# Patient Record
Sex: Male | Born: 1985 | Race: Black or African American | Hispanic: No | Marital: Single | Smoking: Current some day smoker
Health system: Southern US, Community
[De-identification: ages and names within clinical notes are randomized; demographics above are authoritative.]

## PROBLEM LIST (undated history)

## (undated) DIAGNOSIS — E7800 Pure hypercholesterolemia, unspecified: Secondary | ICD-10-CM

## (undated) DIAGNOSIS — I10 Essential (primary) hypertension: Secondary | ICD-10-CM

## (undated) HISTORY — DX: Essential (primary) hypertension: I10

## (undated) HISTORY — DX: Pure hypercholesterolemia, unspecified: E78.00

---
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
## 2006-01-25 ENCOUNTER — Emergency Department (HOSPITAL_COMMUNITY): Admission: EM | Admit: 2006-01-25 | Discharge: 2006-01-25 | Payer: Self-pay | Admitting: Emergency Medicine

## 2006-08-26 ENCOUNTER — Emergency Department (HOSPITAL_COMMUNITY): Admission: EM | Admit: 2006-08-26 | Discharge: 2006-08-26 | Payer: Self-pay | Admitting: Emergency Medicine

## 2007-06-29 ENCOUNTER — Emergency Department (HOSPITAL_COMMUNITY): Admission: EM | Admit: 2007-06-29 | Discharge: 2007-06-29 | Payer: Self-pay | Admitting: Emergency Medicine

## 2008-01-31 ENCOUNTER — Emergency Department (HOSPITAL_COMMUNITY): Admission: EM | Admit: 2008-01-31 | Discharge: 2008-01-31 | Payer: Self-pay | Admitting: Emergency Medicine

## 2008-05-25 ENCOUNTER — Emergency Department (HOSPITAL_COMMUNITY): Admission: EM | Admit: 2008-05-25 | Discharge: 2008-05-25 | Payer: Self-pay | Admitting: Family Medicine

## 2011-01-15 LAB — RAPID STREP SCREEN (MED CTR MEBANE ONLY): Streptococcus, Group A Screen (Direct): NEGATIVE

## 2011-01-22 LAB — GLUCOSE, CAPILLARY: Glucose-Capillary: 113 — ABNORMAL HIGH

## 2012-01-09 ENCOUNTER — Emergency Department (HOSPITAL_COMMUNITY): Payer: Self-pay

## 2012-01-09 ENCOUNTER — Emergency Department (HOSPITAL_COMMUNITY)
Admission: EM | Admit: 2012-01-09 | Discharge: 2012-01-09 | Disposition: A | Discharge status: Home / Self Care | Payer: Self-pay | Attending: Emergency Medicine | Admitting: Emergency Medicine

## 2012-01-09 ENCOUNTER — Encounter (HOSPITAL_COMMUNITY): Payer: Self-pay | Admitting: Emergency Medicine

## 2012-01-09 DIAGNOSIS — J209 Acute bronchitis, unspecified: Secondary | ICD-10-CM | POA: Insufficient documentation

## 2012-01-09 MED ORDER — SULFAMETHOXAZOLE-TRIMETHOPRIM 800-160 MG PO TABS: 1.0000 | ORAL_TABLET | Freq: Two times a day (BID) | ORAL | Status: DC

## 2012-01-09 MED ORDER — GUAIFENESIN 100 MG/5ML PO LIQD: 100.0000 mg | ORAL | Status: DC | PRN

## 2012-01-09 MED ORDER — ALBUTEROL SULFATE HFA 108 (90 BASE) MCG/ACT IN AERS: 1.0000 | INHALATION_SPRAY | Freq: Four times a day (QID) | RESPIRATORY_TRACT | Status: DC | PRN

## 2012-01-09 NOTE — ED Notes (Signed)
Pt presenting to ed with c/o coughing up greenish colored sputum pt states he has been hot but has not taken his temperature. Pt states he feels short of breath. Pt states he woke up this morning with a headache. Pt states chest pain with cough only. Pt states onset since last night with worsening symptoms this morning. Pt with multiple complaints in triage. Pt states "when I woke up this morning I felt like I was about to faint"

## 2012-01-17 NOTE — ED Provider Notes (Signed)
Medical screening examination/treatment/procedure(s) were performed by non-physician practitioner and as supervising physician I was immediately available for consultation/collaboration.  Kimiya Brunelle T Dicy Smigel, MD 01/17/12 0720 

## 2015-04-21 ENCOUNTER — Encounter (HOSPITAL_COMMUNITY): Payer: Self-pay | Admitting: Emergency Medicine

## 2015-04-21 ENCOUNTER — Emergency Department (HOSPITAL_COMMUNITY): Payer: Self-pay

## 2015-04-21 ENCOUNTER — Emergency Department (HOSPITAL_COMMUNITY)
Admission: EM | Admit: 2015-04-21 | Discharge: 2015-04-21 | Disposition: A | Discharge status: Home / Self Care | Payer: Self-pay | Attending: Emergency Medicine | Admitting: Emergency Medicine

## 2015-04-21 DIAGNOSIS — Y9289 Other specified places as the place of occurrence of the external cause: Secondary | ICD-10-CM | POA: Insufficient documentation

## 2015-04-21 DIAGNOSIS — W450XXA Nail entering through skin, initial encounter: Secondary | ICD-10-CM | POA: Insufficient documentation

## 2015-04-21 DIAGNOSIS — Z792 Long term (current) use of antibiotics: Secondary | ICD-10-CM | POA: Insufficient documentation

## 2015-04-21 DIAGNOSIS — S91332A Puncture wound without foreign body, left foot, initial encounter: Secondary | ICD-10-CM | POA: Insufficient documentation

## 2015-04-21 DIAGNOSIS — F17210 Nicotine dependence, cigarettes, uncomplicated: Secondary | ICD-10-CM | POA: Insufficient documentation

## 2015-04-21 DIAGNOSIS — Z23 Encounter for immunization: Secondary | ICD-10-CM | POA: Insufficient documentation

## 2015-04-21 DIAGNOSIS — Y9389 Activity, other specified: Secondary | ICD-10-CM | POA: Insufficient documentation

## 2015-04-21 DIAGNOSIS — Z79899 Other long term (current) drug therapy: Secondary | ICD-10-CM | POA: Insufficient documentation

## 2015-04-21 DIAGNOSIS — Y998 Other external cause status: Secondary | ICD-10-CM | POA: Insufficient documentation

## 2015-04-21 DIAGNOSIS — Z880 Allergy status to penicillin: Secondary | ICD-10-CM | POA: Insufficient documentation

## 2015-04-21 MED ORDER — IBUPROFEN 200 MG PO TABS
600.0000 mg | ORAL_TABLET | Freq: Once | ORAL | Status: AC
  Administered 2015-04-21: 600 mg via ORAL
  Filled 2015-04-21: qty 3

## 2015-04-21 MED ORDER — CIPROFLOXACIN HCL 500 MG PO TABS: 500.0000 mg | ORAL_TABLET | Freq: Two times a day (BID) | ORAL | Status: DC

## 2015-04-21 MED ORDER — TETANUS-DIPHTH-ACELL PERTUSSIS 5-2.5-18.5 LF-MCG/0.5 IM SUSP
0.5000 mL | Freq: Once | INTRAMUSCULAR | Status: AC
  Administered 2015-04-21: 0.5 mL via INTRAMUSCULAR
  Filled 2015-04-21 (×2): qty 0.5

## 2015-04-21 NOTE — Progress Notes (Addendum)
Pt stepped on a rusty nail. He is unsure when his last tetanus shot was. Left foot soaked in sterile water and betadine.Pt is able to stand on his foot. Tolerated his tetanus shot in the rt deltoid. Pt c/o right groin pain and asked if he could take some heat packs home . He also requested a work note. Pt requested crutches . Ortho tech was called. (6:30pm)

## 2015-04-21 NOTE — ED Notes (Signed)
Pt c/o left foot pain after stepping on nail 3 hours ago. Unsure of last tetanus. Pt unsure if fragment of nail is under skin. Puncture mark present, not currently bleeding.

## 2015-04-21 NOTE — Discharge Instructions (Signed)
Please read and follow all provided instructions.  Your diagnoses today include:  1. Puncture wound of foot, left, initial encounter     Tests performed today include:  Vital signs. See below for your results today.   Medications prescribed:   Ciprofloxacin 500mg  two times a day for 5 days  Home care instructions:  Follow any educational materials contained in this packet. Be sure to wash the wound with soap and water and cover with a bandage.   Follow-up instructions: Please follow-up with your primary care provider in the next 48 hours for further evaluation of symptoms and treatment   Return instructions:   Please return to the Emergency Department if you do not get better, if you get worse, or new symptoms OR  - Fever (temperature greater than 101.40F)  - Bleeding that does not stop with holding pressure to the area    -Severe pain (please note that you may be more sore the day after your accident)  - Chest Pain  - Difficulty breathing  - Severe nausea or vomiting  - Inability to tolerate food and liquids  - Passing out  - Skin becoming red around your wounds  - Change in mental status (confusion or lethargy)  - New numbness or weakness     Please return if you have any other emergent concerns.  Additional Information:  Your vital signs today were: BP 137/92 mmHg   Pulse 116   Temp(Src) 98 F (36.7 C) (Oral)   Resp 18   SpO2 96% If your blood pressure (BP) was elevated above 135/85 this visit, please have this repeated by your doctor within one month. ---------------

## 2015-04-21 NOTE — ED Provider Notes (Signed)
CSN: [id number]     Arrival date & time 04/21/15  1702 History  By signing my name below, I, Evon Slackerrance Branch, attest that this documentation has been prepared under the direction and in the presence of Audry Piliyler Alfio Loescher, PA-C. Electronically Signed: Evon Slackerrance Branch, ED Scribe. 04/21/2015. 5:30 PM.    Chief Complaint  Patient presents with  . Foot Injury   Patient is a 29 y.o. male presenting with foot injury. The history is provided by the patient. No language interpreter was used.  Foot Injury Associated symptoms: no back pain, no fatigue and no fever    HPI Comments: Brian Mclean is a 29 y.o. male who presents to the Emergency Department complaining of left foot injury onset 3 hours PTA. Pt rates the severity of pain 10/10 and throbbing. Pt states that he stepped on a nail that went through his shoe. The nail was located on a piece of wood and appeared rusted. Pt states that he immediately removed the nail due to it still being in the shoe. Pt states that bleeding is controlled after applying peroxide and alcohol. Pt denies any medications PTA. Pt states that that pain is worse when ambulating or bearing weight.  Pt denies numbness or tingling.   Pt is also complaining of a chronic umbilical hernia that worse when coughing. Denies nausea, vomiting Sob or CP. Pt reports smoking 4-5 cigarettes per day for the last 16 years.     History reviewed. No pertinent past medical history. History reviewed. No pertinent past surgical history. No family history on file. Social History  Substance Use Topics  . Smoking status: Current Every Day Smoker    Types: Cigarettes  . Smokeless tobacco: None  . Alcohol Use: No    Review of Systems  Constitutional: Negative for fever, chills, diaphoresis and fatigue.  HENT: Negative for congestion, sinus pressure, sore throat and tinnitus.   Eyes: Negative for visual disturbance.  Respiratory: Negative for cough and shortness of breath.   Cardiovascular:  Negative for chest pain.  Gastrointestinal: Negative for nausea, vomiting, abdominal pain, diarrhea and constipation.  Endocrine: Negative for cold intolerance and heat intolerance.  Genitourinary: Negative for dysuria and hematuria.  Musculoskeletal: Negative for back pain.  Skin: Positive for wound. Negative for color change.  Neurological: Negative for dizziness, syncope, weakness, numbness and headaches.   Allergies  Penicillins  Home Medications   Prior to Admission medications   Medication Sig Start Date End Date Taking? Authorizing Provider  albuterol (PROVENTIL HFA;VENTOLIN HFA) 108 (90 BASE) MCG/ACT inhaler Inhale 1-2 puffs into the lungs every 6 (six) hours as needed for wheezing. 01/09/12   Kaitlyn Szekalski, PA-C  guaiFENesin (ROBITUSSIN) 100 MG/5ML liquid Take 5-10 mLs (100-200 mg total) by mouth every 4 (four) hours as needed for cough. 01/09/12   Kaitlyn Szekalski, PA-C  Pseudoeph-Doxylamine-DM-APAP (NYQUIL MULTI-SYMPTOM PO) Take by mouth once.    Historical Provider, MD  sulfamethoxazole-trimethoprim (SEPTRA DS) 800-160 MG per tablet Take 1 tablet by mouth 2 (two) times daily. 01/09/12   Kaitlyn Szekalski, PA-C   BP 137/92 mmHg  Pulse 116  Temp(Src) 98 F (36.7 C) (Oral)  Resp 18  SpO2 96%   Physical Exam  Constitutional: He is oriented to person, place, and time. He appears well-developed and well-nourished. No distress.  HENT:  Head: Normocephalic and atraumatic.  Eyes: Conjunctivae and EOM are normal.  Neck: Neck supple. No tracheal deviation present.  Cardiovascular: Normal rate and regular rhythm.   Pulmonary/Chest: Effort normal and breath sounds normal.  No respiratory distress.  Abdominal: Normal appearance. There is no tenderness.    Musculoskeletal:       Left ankle: He exhibits decreased range of motion. He exhibits no swelling and no deformity.  Left Foot: Pain with flexion and extension, good cap refill, distal pulses intact  Neurological: He is alert  and oriented to person, place, and time.  Skin: Skin is warm and dry.  Psychiatric: He has a normal mood and affect. His speech is normal and behavior is normal.  Nursing note and vitals reviewed.   ED Course  Procedures (including critical care time) DIAGNOSTIC STUDIES: Oxygen Saturation is 96% on RA, normal by my interpretation.    COORDINATION OF CARE: 5:30 PM-Discussed treatment plan with pt at bedside and pt agreed to plan.   Labs Review Labs Reviewed - No data to display  Imaging Review Dg Foot Complete Left  04/21/2015  CLINICAL DATA:  Plantar left foot pain after being punctured in the plantar aspect of the foot with a nail today. EXAM: LEFT FOOT - COMPLETE 3+ VIEW COMPARISON:  None. FINDINGS: Posterior calcaneal spur formation. Minimal distal anterior tibial spur formation. No fracture, dislocation or radiopaque foreign body seen. No soft tissue air visualized. IMPRESSION: No fracture. Electronically Signed   By: Beckie Salts M.D.   On: 04/21/2015 18:03     EKG Interpretation None      MDM  I have reviewed relevant imaging studies. I obtained HPI from historian.  ED Course: XR L Foot to see if foreign body present- No foreign body. No fracture.   Assessment: 46y M with no sig pmh presents with puncture wound to arch of left foot. Pulses are intact. Good cap refill in all digits. Able to move ankle with minimal discomfort. Sensation intact. VS Stable. Patient in NAD. Will order Xray to see if foreign body still present. Will give tetanus and Rx for Cipro at discharge due to possible Pseudomonas infection.    Disposition/Plan:  DC home with ABX, given crutches for support Additional Verbal discharge instructions given and discussed with patient.  Pt Instructed to f/u with PCP in the next 48 hours for evaluation and treatment of symptoms.  Patient was discussed with Derwood Kaplan, MD   Final diagnoses:  Puncture wound of foot, left, initial encounter     I  personally performed the services described in this documentation, which was scribed in my presence. The recorded information has been reviewed and is accurate.     Audry Pili, PA-C 04/21/15 1610  Derwood Kaplan, MD 04/21/15 [phone]

## 2015-05-04 ENCOUNTER — Encounter (HOSPITAL_COMMUNITY): Payer: Self-pay | Admitting: Emergency Medicine

## 2015-05-04 ENCOUNTER — Emergency Department (HOSPITAL_COMMUNITY)
Admission: EM | Admit: 2015-05-04 | Discharge: 2015-05-05 | Disposition: A | Discharge status: Home / Self Care | Payer: Self-pay | Attending: Emergency Medicine | Admitting: Emergency Medicine

## 2015-05-04 DIAGNOSIS — Z79899 Other long term (current) drug therapy: Secondary | ICD-10-CM | POA: Insufficient documentation

## 2015-05-04 DIAGNOSIS — Z792 Long term (current) use of antibiotics: Secondary | ICD-10-CM | POA: Insufficient documentation

## 2015-05-04 DIAGNOSIS — M79672 Pain in left foot: Secondary | ICD-10-CM | POA: Insufficient documentation

## 2015-05-04 DIAGNOSIS — F17210 Nicotine dependence, cigarettes, uncomplicated: Secondary | ICD-10-CM | POA: Insufficient documentation

## 2015-05-04 DIAGNOSIS — Z880 Allergy status to penicillin: Secondary | ICD-10-CM | POA: Insufficient documentation

## 2015-05-04 NOTE — ED Notes (Signed)
Pt states he stepped on a nail about 3 weeks ago and it went through his shoe and punctured the arch of his foot   Pt states he was seen and given some ibuprofen  Pt states his foot continues to hurt and he thinks he may have some bruising on the top of his foot  Pt states his job requires him to be on his feet a lot and he is unable to do that due to the pain

## 2015-05-04 NOTE — ED Provider Notes (Signed)
CSN: [id number]     Arrival date & time 05/04/15  2010 History  By signing my name below, I, Gonzella LexKimberly Bianca Gray, attest that this documentation has been prepared under the direction and in the presence of Sharilyn SitesLisa Jalia Zuniga, PA-C. Electronically Signed: Gonzella LexKimberly Bianca Gray, Scribe. 05/04/2015. 10:01 PM.   Chief Complaint  Patient presents with  . Foot Pain    The history is provided by the patient. No language interpreter was used.    HPI Comments: Brian Mclean is a 30 y.o. male who presents to the Emergency Department complaining of left foot pain. Patient states he stepped on a nail 3 weeks ago. He is wearing a boot at the time, however nail went through his shoe and into his foot. He was seen in the ED afterwards. His x-ray was negative.  Patient states he has had persistent pain in his left foot since this time. Pain is worse with ambulation. Patient states his job requires him to stand for long periods of time. He has not followed up with anyone regarding this since his initial ED visit.  Denies numbness/weakness of left foot.  No fever, chills, drainage.  No hx of DM.  Tetanus was updated at prior visit.   History reviewed. No pertinent past medical history. History reviewed. No pertinent past surgical history. Family History  Problem Relation Age of Onset  . Hypertension Other   . Stroke Other   . CAD Other   . Diabetes Other   . Asthma Other    Social History  Substance Use Topics  . Smoking status: Current Every Day Smoker    Types: Cigarettes  . Smokeless tobacco: None  . Alcohol Use: No    Review of Systems  Musculoskeletal: Positive for arthralgias.  All other systems reviewed and are negative.  Allergies  Penicillins  Home Medications   Prior to Admission medications   Medication Sig Start Date End Date Taking? Authorizing Provider  albuterol (PROVENTIL HFA;VENTOLIN HFA) 108 (90 BASE) MCG/ACT inhaler Inhale 1-2 puffs into the lungs every 6 (six) hours as needed  for wheezing. 01/09/12   Kaitlyn Szekalski, PA-C  ciprofloxacin (CIPRO) 500 MG tablet Take 1 tablet (500 mg total) by mouth 2 (two) times daily. 04/21/15   Audry Piliyler Mohr, PA-C  guaiFENesin (ROBITUSSIN) 100 MG/5ML liquid Take 5-10 mLs (100-200 mg total) by mouth every 4 (four) hours as needed for cough. 01/09/12   Kaitlyn Szekalski, PA-C  Pseudoeph-Doxylamine-DM-APAP (NYQUIL MULTI-SYMPTOM PO) Take by mouth once.    Historical Provider, MD  sulfamethoxazole-trimethoprim (SEPTRA DS) 800-160 MG per tablet Take 1 tablet by mouth 2 (two) times daily. 01/09/12   Kaitlyn Szekalski, PA-C   BP 134/91 mmHg  Pulse 86  Temp(Src) 98 F (36.7 C) (Oral)  Resp 18  Wt 288 lb (130.636 kg)  SpO2 100%   Physical Exam  Constitutional: He is oriented to person, place, and time. He appears well-developed and well-nourished. No distress.  HENT:  Head: Normocephalic and atraumatic.  Mouth/Throat: Oropharynx is clear and moist.  Eyes: Conjunctivae and EOM are normal. Pupils are equal, round, and reactive to light.  Neck: Normal range of motion. Neck supple.  Cardiovascular: Normal rate, regular rhythm and normal heart sounds.   Pulmonary/Chest: Effort normal and breath sounds normal. No respiratory distress. He has no wheezes.  Musculoskeletal: Normal range of motion. He exhibits no edema.  Left foot grossly normal in appearance aside from evidence of well healed puncture wound to sole of foot; no surrounding redness, swelling, or  signs of infection; ambulatory  Neurological: He is alert and oriented to person, place, and time.  Skin: Skin is warm and dry. He is not diaphoretic.  Psychiatric: He has a normal mood and affect.  Nursing note and vitals reviewed.   ED Course  Procedures (including critical care time) DIAGNOSTIC STUDIES:    Oxygen Saturation is 100% on RA, normal by my interpretation.   COORDINATION OF CARE:  10:01 PM Discussed treatment plan with pt at bedside and pt agreed to plan.   Labs  Review Labs Reviewed - No data to display  Imaging Review No results found. I have personally reviewed and evaluated these images and lab results as part of my medical decision-making.   EKG Interpretation None      MDM   Final diagnoses:  Left foot pain   30 y.o. M here with continued left foot pain after stepping on a nail 3 weeks ago.  Patient has no signs of infection on exam.  He has a well healed puncture wound to sole of left foot.  He is ambulatory without difficulty.  Initial x-ray done at time of injury was negative for any acute findings.  At this time, do not feel further intervention warranted at this time.  Patient will be d/c home with supportive care. He was given referral to orthopedics if needed.  Discussed plan with patient, he/she acknowledged understanding and agreed with plan of care.  Rx naprosyn.  Return precautions given for new or worsening symptoms.  I personally performed the services described in this documentation, which was scribed in my presence. The recorded information has been reviewed and is accurate.   Garlon Hatchet, PA-C 05/05/15 1610  April Palumbo, MD 05/05/15 0100

## 2015-05-05 MED ORDER — NAPROXEN 500 MG PO TABS: 500.0000 mg | ORAL_TABLET | Freq: Two times a day (BID) | ORAL | Status: DC

## 2015-05-05 NOTE — Discharge Instructions (Signed)
Wear ace wrap to help with pain. You may follow-up with orthopedics if needed. Take naprosyn as directed for pain control. Return here for new concerns.

## 2015-10-31 ENCOUNTER — Encounter (HOSPITAL_COMMUNITY): Payer: Self-pay | Admitting: Emergency Medicine

## 2015-10-31 ENCOUNTER — Emergency Department (HOSPITAL_COMMUNITY)
Admission: EM | Admit: 2015-10-31 | Discharge: 2015-10-31 | Disposition: A | Discharge status: Home / Self Care | Payer: Self-pay | Attending: Emergency Medicine | Admitting: Emergency Medicine

## 2015-10-31 DIAGNOSIS — M545 Low back pain, unspecified: Secondary | ICD-10-CM

## 2015-10-31 DIAGNOSIS — F17210 Nicotine dependence, cigarettes, uncomplicated: Secondary | ICD-10-CM | POA: Insufficient documentation

## 2015-10-31 MED ORDER — METHOCARBAMOL 500 MG PO TABS: 500.0000 mg | ORAL_TABLET | Freq: Two times a day (BID) | ORAL | Status: DC

## 2015-10-31 MED ORDER — NAPROXEN 500 MG PO TABS: 500.0000 mg | ORAL_TABLET | Freq: Two times a day (BID) | ORAL | Status: DC

## 2015-10-31 MED ORDER — METHOCARBAMOL 500 MG PO TABS
500.0000 mg | ORAL_TABLET | Freq: Once | ORAL | Status: AC
  Administered 2015-10-31: 500 mg via ORAL
  Filled 2015-10-31: qty 1

## 2015-10-31 NOTE — ED Notes (Signed)
Pt c/o lower back pain x1 week. Denies injury and urinary symptoms.

## 2015-10-31 NOTE — ED Provider Notes (Signed)
CSN: [id number]     Arrival date & time 10/31/15  1201 History  By signing my name below, I, Tanda Rockers, attest that this documentation has been prepared under the direction and in the presence of Tiahna Cure, PA-C. Electronically Signed: Tanda Rockers, ED Scribe. 10/31/2015. 2:09 PM.   Chief Complaint  Patient presents with  . Back Pain   The history is provided by the patient. No language interpreter was used.    HPI Comments: Brian Mclean is a 30 y.o. male who presents to the Emergency Department complaining of gradual onset, constant, diffuse lower back pain x 1 week. Pt states the back pain has been mild and aching over the past week but worsened today. Pt reports that he began having intermittent spasms to the area today, prompting him to come to the ED. He states that the pain was so severe that he had to leave work. The pain is exacerbated by bending and lifting. The pain intermittently radiates to right lateral hip when the spasms are present. No recent trauma or injury to the back. Pt does do repetitive bending and heavy lifting at work. He has been taking Aleve without relief. No hx of similar back pain. He is able to ambulate. Denies fever, abdominal pain, nausea, vomiting, dysuria, weakness, numbness, urinary or bowel incontinence, saddle anesthesia, or any other associated symptoms. No hx of CA or IVDU. Pt does not have a PCP.   History reviewed. No pertinent past medical history. History reviewed. No pertinent past surgical history. Family History  Problem Relation Age of Onset  . Hypertension Other   . Stroke Other   . CAD Other   . Diabetes Other   . Asthma Other    Social History  Substance Use Topics  . Smoking status: Current Every Day Smoker    Types: Cigarettes  . Smokeless tobacco: None  . Alcohol Use: No    Review of Systems  Constitutional: Negative for fever.  Gastrointestinal: Negative for nausea and vomiting.  Musculoskeletal: Positive for back  pain. Negative for gait problem and neck pain.  Neurological: Negative for weakness and numbness.  All other systems reviewed and are negative.  Allergies  Penicillins  Home Medications   Prior to Admission medications   Medication Sig Start Date End Date Taking? Authorizing Provider  albuterol (PROVENTIL HFA;VENTOLIN HFA) 108 (90 BASE) MCG/ACT inhaler Inhale 1-2 puffs into the lungs every 6 (six) hours as needed for wheezing. 01/09/12   Kaitlyn Szekalski, PA-C  ciprofloxacin (CIPRO) 500 MG tablet Take 1 tablet (500 mg total) by mouth 2 (two) times daily. 04/21/15   Audry Pili, PA-C  guaiFENesin (ROBITUSSIN) 100 MG/5ML liquid Take 5-10 mLs (100-200 mg total) by mouth every 4 (four) hours as needed for cough. 01/09/12   Kaitlyn Szekalski, PA-C  methocarbamol (ROBAXIN) 500 MG tablet Take 1 tablet (500 mg total) by mouth 2 (two) times daily. 10/31/15   Jarielys Girardot, PA-C  naproxen (NAPROSYN) 500 MG tablet Take 1 tablet (500 mg total) by mouth 2 (two) times daily with a meal. 05/05/15   Garlon Hatchet, PA-C  naproxen (NAPROSYN) 500 MG tablet Take 1 tablet (500 mg total) by mouth 2 (two) times daily. 10/31/15   Tiffiany Beadles, PA-C  Pseudoeph-Doxylamine-DM-APAP (NYQUIL MULTI-SYMPTOM PO) Take by mouth once.    Historical Provider, MD  sulfamethoxazole-trimethoprim (SEPTRA DS) 800-160 MG per tablet Take 1 tablet by mouth 2 (two) times daily. 01/09/12   Kaitlyn Szekalski, PA-C   BP 137/90 mmHg  Pulse  82  Temp(Src) 98 F (36.7 C) (Oral)  Resp 20  Ht 5\' 11"  (1.803 m)  Wt 130.182 kg  BMI 40.05 kg/m2  SpO2 99%   Physical Exam  Constitutional: He appears well-developed and well-nourished. No distress.  Nontoxic appearing  HENT:  Head: Normocephalic and atraumatic.  Right Ear: External ear normal.  Left Ear: External ear normal.  Eyes: Conjunctivae are normal. Right eye exhibits no discharge. Left eye exhibits no discharge. No scleral icterus.  Neck: Normal range of motion.  Cardiovascular: Normal  rate and intact distal pulses.   Pulmonary/Chest: Effort normal.  Abdominal: Soft. He exhibits no distension. There is no tenderness.  Musculoskeletal: Normal range of motion.       Lumbar back: He exhibits tenderness. He exhibits normal range of motion, no bony tenderness and no deformity.  Mild, generalized TTP of the lumbar region. No focal TTP of the lumbar spine. No bony deformities or step offs. FROM of the spine intact. Negative SLR. FROM of BLE intact. Pt ambulates with a steady gait unassisted.   Neurological: He is alert. Coordination normal.  5/5 strength of the BLE. Sensation to light touch intact throughout.   Skin: Skin is warm and dry.  Psychiatric: He has a normal mood and affect. His behavior is normal.  Nursing note and vitals reviewed.   ED Course  Procedures (including critical care time)  DIAGNOSTIC STUDIES: Oxygen Saturation is 98% on RA, normal by my interpretation.    COORDINATION OF CARE: 2:07 PM-Discussed treatment plan which includes Rx muscle relaxant and referral to PCP/orthopedist with pt at bedside and pt agreed to plan.   Labs Review Labs Reviewed - No data to display  Imaging Review No results found. I have personally reviewed and evaluated these images and lab results as part of my medical decision-making.   EKG Interpretation None      MDM   Final diagnoses:  Bilateral low back pain without sciatica   Patient presenting with back pain x 1 week with worsening today. Hx of physically demanding job with heavy lifting. Afebrile. Non-focal neuro exam. Mild generalized lumbar TTP without focal midline TTP. Negative SLR. Patient is able to ambulate though with some discomfort. No loss of bowel or bladder control. No numbness or weakness in the lower extremities. No concern for cauda equina. No history of IVDU or cancer. No back pain red flags. Conservative therapy including back exercises, heat, ice, tylenol or ibuprofen discussed. Will give muscle  relaxer for back spasms. Discussed side effects of muscle relaxer and avoiding driving. Pt is to follow up with PCP as needed. Given referral infromatino for [facility]. Return precautions discussed and given in discharge paperwork. Pt is stable for discharge.   I personally performed the services described in this documentation, which was scribed in my presence. The recorded information has been reviewed and is accurate.      Alveta HeimlichStevi Shakita Keir, PA-C 10/31/15 1421  Linwood DibblesJon Knapp, MD 11/01/15 1029

## 2015-10-31 NOTE — Discharge Instructions (Signed)
Back Pain, Adult Back pain is very common in adults.The cause of back pain is rarely dangerous and the pain often gets better over time.The cause of your back pain may not be known. Some common causes of back pain include:  Strain of the muscles or ligaments supporting the spine.  Wear and tear (degeneration) of the spinal disks.  Arthritis.  Direct injury to the back. For many people, back pain may return. Since back pain is rarely dangerous, most people can learn to manage this condition on their own. HOME CARE INSTRUCTIONS Watch your back pain for any changes. The following actions may help to lessen any discomfort you are feeling:  Remain active. It is stressful on your back to sit or stand in one place for long periods of time. Do not sit, drive, or stand in one place for more than 30 minutes at a time. Take short walks on even surfaces as soon as you are able.Try to increase the length of time you walk each day.  Exercise regularly as directed by your health care provider. Exercise helps your back heal faster. It also helps avoid future injury by keeping your muscles strong and flexible.  Do not stay in bed.Resting more than 1-2 days can delay your recovery.  Pay attention to your body when you bend and lift. The most comfortable positions are those that put less stress on your recovering back. Always use proper lifting techniques, including:  Bending your knees.  Keeping the load close to your body.  Avoiding twisting.  Find a comfortable position to sleep. Use a firm mattress and lie on your side with your knees slightly bent. If you lie on your back, put a pillow under your knees.  Avoid feeling anxious or stressed.Stress increases muscle tension and can worsen back pain.It is important to recognize when you are anxious or stressed and learn ways to manage it, such as with exercise.  Take medicines only as directed by your health care provider. Over-the-counter  medicines to reduce pain and inflammation are often the most helpful.Your health care provider may prescribe muscle relaxant drugs.These medicines help dull your pain so you can more quickly return to your normal activities and healthy exercise.  Apply ice to the injured area:  Put ice in a plastic bag.  Place a towel between your skin and the bag.  Leave the ice on for 20 minutes, 2-3 times a day for the first 2-3 days. After that, ice and heat may be alternated to reduce pain and spasms.  Maintain a healthy weight. Excess weight puts extra stress on your back and makes it difficult to maintain good posture. SEEK MEDICAL CARE IF:  You have pain that is not relieved with rest or medicine.  You have increasing pain going down into the legs or buttocks.  You have pain that does not improve in one week.  You have night pain.  You lose weight.  You have a fever or chills. SEEK IMMEDIATE MEDICAL CARE IF:   You develop new bowel or bladder control problems.  You have unusual weakness or numbness in your arms or legs.  You develop nausea or vomiting.  You develop abdominal pain.  You feel faint.   This information is not intended to replace advice given to you by your health care provider. Make sure you discuss any questions you have with your health care provider.   Document Released: 04/09/2005 Document Revised: 04/30/2014 Document Reviewed: 08/11/2013 Elsevier Interactive Patient Education 2016 [employer]  Back Exercises The following exercises strengthen the muscles that help to support the back. They also help to keep the lower back flexible. Doing these exercises can help to prevent back pain or lessen existing pain. If you have back pain or discomfort, try doing these exercises 2-3 times each day or as told by your health care provider. When the pain goes away, do them once each day, but increase the number of times that you repeat the steps for each exercise (do more  repetitions). If you do not have back pain or discomfort, do these exercises once each day or as told by your health care provider. EXERCISES Single Knee to Chest Repeat these steps 3-5 times for each leg:  Lie on your back on a firm bed or the floor with your legs extended.  Bring one knee to your chest. Your other leg should stay extended and in contact with the floor.  Hold your knee in place by grabbing your knee or thigh.  Pull on your knee until you feel a gentle stretch in your lower back.  Hold the stretch for 10-30 seconds.  Slowly release and straighten your leg. Pelvic Tilt Repeat these steps 5-10 times:  Lie on your back on a firm bed or the floor with your legs extended.  Bend your knees so they are pointing toward the ceiling and your feet are flat on the floor.  Tighten your lower abdominal muscles to press your lower back against the floor. This motion will tilt your pelvis so your tailbone points up toward the ceiling instead of pointing to your feet or the floor.  With gentle tension and even breathing, hold this position for 5-10 seconds. Cat-Cow Repeat these steps until your lower back becomes more flexible:  Get into a hands-and-knees position on a firm surface. Keep your hands under your shoulders, and keep your knees under your hips. You may place padding under your knees for comfort.  Let your head hang down, and point your tailbone toward the floor so your lower back becomes rounded like the back of a cat.  Hold this position for 5 seconds.  Slowly lift your head and point your tailbone up toward the ceiling so your back forms a sagging arch like the back of a cow.  Hold this position for 5 seconds. Press-Ups Repeat these steps 5-10 times:  Lie on your abdomen (face-down) on the floor.  Place your palms near your head, about shoulder-width apart.  While you keep your back as relaxed as possible and keep your hips on the floor, slowly straighten  your arms to raise the top half of your body and lift your shoulders. Do not use your back muscles to raise your upper torso. You may adjust the placement of your hands to make yourself more comfortable.  Hold this position for 5 seconds while you keep your back relaxed.  Slowly return to lying flat on the floor. Bridges Repeat these steps 10 times:  Lie on your back on a firm surface.  Bend your knees so they are pointing toward the ceiling and your feet are flat on the floor.  Tighten your buttocks muscles and lift your buttocks off of the floor until your waist is at almost the same height as your knees. You should feel the muscles working in your buttocks and the back of your thighs. If you do not feel these muscles, slide your feet 1-2 inches farther away from your buttocks.  Hold this  position for 3-5 seconds.  Slowly lower your hips to the starting position, and allow your buttocks muscles to relax completely. If this exercise is too easy, try doing it with your arms crossed over your chest. Abdominal Crunches Repeat these steps 5-10 times:  Lie on your back on a firm bed or the floor with your legs extended.  Bend your knees so they are pointing toward the ceiling and your feet are flat on the floor.  Cross your arms over your chest.  Tip your chin slightly toward your chest without bending your neck.  Tighten your abdominal muscles and slowly raise your trunk (torso) high enough to lift your shoulder blades a tiny bit off of the floor. Avoid raising your torso higher than that, because it can put too much stress on your low back and it does not help to strengthen your abdominal muscles.  Slowly return to your starting position. Back Lifts Repeat these steps 5-10 times: 1. Lie on your abdomen (face-down) with your arms at your sides, and rest your forehead on the floor. 2. Tighten the muscles in your legs and your buttocks. 3. Slowly lift your chest off of the floor while  you keep your hips pressed to the floor. Keep the back of your head in line with the curve in your back. Your eyes should be looking at the floor. 4. Hold this position for 3-5 seconds. 5. Slowly return to your starting position. SEEK MEDICAL CARE IF:  Your back pain or discomfort gets much worse when you do an exercise.  Your back pain or discomfort does not lessen within 2 hours after you exercise. If you have any of these problems, stop doing these exercises right away. Do not do them again unless your health care provider says that you can. SEEK IMMEDIATE MEDICAL CARE IF:  You develop sudden, severe back pain. If this happens, stop doing the exercises right away. Do not do them again unless your health care provider says that you can.   This information is not intended to replace advice given to you by your health care provider. Make sure you discuss any questions you have with your health care provider.   Document Released: 05/17/2004 Document Revised: 12/29/2014 Document Reviewed: 06/03/2014 Elsevier Interactive Patient Education 2016 [employer] Injury Prevention Back injuries can be very painful. They can also be difficult to heal. After having one back injury, you are more likely to injure your back again. It is important to learn how to avoid injuring or re-injuring your back. The following tips can help you to prevent a back injury. WHAT SHOULD I KNOW ABOUT PHYSICAL FITNESS?  Exercise for 30 minutes per day on most days of the week or as directed by your health care provider. Make sure to:  Do aerobic exercises, such as walking, jogging, biking, or swimming.  Do exercises that increase balance and strength, such as tai chi and yoga. These can decrease your risk of falling and injuring your back.  Do stretching exercises to help with flexibility.  Try to develop strong abdominal muscles. Your abdominal muscles provide a lot of the support that is needed by your  back.  Maintain a healthy weight. This helps to decrease your risk of a back injury. WHAT SHOULD I KNOW ABOUT MY DIET?  Talk with your health care provider about your overall diet. Take supplements and vitamins only as directed by your health care provider.  Talk with your health care provider about how  much calcium and vitamin D you need each day. These nutrients help to prevent weakening of the bones (osteoporosis). Osteoporosis can cause broken (fractured) bones, which lead to back pain.  Include good sources of calcium in your diet, such as dairy products, green leafy vegetables, and products that have had calcium added to them (fortified).  Include good sources of vitamin D in your diet, such as milk and foods that are fortified with vitamin D. WHAT SHOULD I KNOW ABOUT MY POSTURE?  Sit up straight and stand up straight. Avoid leaning forward when you sit or hunching over when you stand.  Choose chairs that have good low-back (lumbar) support.  If you work at a desk, sit close to it so you do not need to lean over. Keep your chin tucked in. Keep your neck drawn back, and keep your elbows bent at a right angle. Your arms should look like the letter "L."  Sit high and close to the steering wheel when you drive. Add a lumbar support to your car seat, if needed.  Avoid sitting or standing in one position for very long. Take breaks to get up, stretch, and walk around at least one time every hour. Take breaks every hour if you are driving for long periods of time.  Sleep on your side with your knees slightly bent, or sleep on your back with a pillow under your knees. Do not lie on the front of your body to sleep. WHAT SHOULD I KNOW ABOUT LIFTING, TWISTING, AND REACHING? Lifting and Heavy Lifting  Avoid heavy lifting, especially repetitive heavy lifting. If you must do heavy lifting:  Stretch before lifting.  Work slowly.  Rest between lifts.  Use a tool such as a cart or a dolly to  move objects if one is available.  Make several small trips instead of carrying one heavy load.  Ask for help when you need it, especially when moving big objects.  Follow these steps when lifting:  Stand with your feet shoulder-width apart.  Get as close to the object as you can. Do not try to pick up a heavy object that is far from your body.  Use handles or lifting straps if they are available.  Bend at your knees. Squat down, but keep your heels off the floor.  Keep your shoulders pulled back, your chin tucked in, and your back straight.  Lift the object slowly while you tighten the muscles in your legs, abdomen, and buttocks. Keep the object as close to the center of your body as possible.  Follow these steps when putting down a heavy load:  Stand with your feet shoulder-width apart.  Lower the object slowly while you tighten the muscles in your legs, abdomen, and buttocks. Keep the object as close to the center of your body as possible.  Keep your shoulders pulled back, your chin tucked in, and your back straight.  Bend at your knees. Squat down, but keep your heels off the floor.  Use handles or lifting straps if they are available. Twisting and Reaching  Avoid lifting heavy objects above your waist.  Do not twist at your waist while you are lifting or carrying a load. If you need to turn, move your feet.  Do not bend over without bending at your knees.  Avoid reaching over your head, across a table, or for an object on a high surface. WHAT ARE SOME OTHER TIPS?  Avoid wet floors and icy ground. Keep sidewalks clear of ice  to prevent falls.  Do not sleep on a mattress that is too soft or too hard.  Keep items that are used frequently within easy reach.  Put heavier objects on shelves at waist level, and put lighter objects on lower or higher shelves.  Find ways to decrease your stress, such as exercise, massage, or relaxation techniques. Stress can build up in  your muscles. Tense muscles are more vulnerable to injury.  Talk with your health care provider if you feel anxious or depressed. These conditions can make back pain worse.  Wear flat heel shoes with cushioned soles.  Avoid sudden movements.  Use both shoulder straps when carrying a backpack.  Do not use any tobacco products, including cigarettes, chewing tobacco, or electronic cigarettes. If you need help quitting, ask your health care provider.   This information is not intended to replace advice given to you by your health care provider. Make sure you discuss any questions you have with your health care provider.   Document Released: 05/17/2004 Document Revised: 08/24/2014 Document Reviewed: 04/13/2014 Elsevier Interactive Patient Education [employer].

## 2016-07-18 ENCOUNTER — Encounter (HOSPITAL_COMMUNITY): Payer: Self-pay | Admitting: Emergency Medicine

## 2016-07-18 ENCOUNTER — Ambulatory Visit (HOSPITAL_COMMUNITY)
Admission: EM | Admit: 2016-07-18 | Discharge: 2016-07-18 | Disposition: A | Discharge status: Home / Self Care | Payer: BLUE CROSS/BLUE SHIELD | Attending: Internal Medicine | Admitting: Internal Medicine

## 2016-07-18 DIAGNOSIS — H6691 Otitis media, unspecified, right ear: Secondary | ICD-10-CM

## 2016-07-18 DIAGNOSIS — R0981 Nasal congestion: Secondary | ICD-10-CM | POA: Diagnosis not present

## 2016-07-18 DIAGNOSIS — R42 Dizziness and giddiness: Secondary | ICD-10-CM | POA: Diagnosis not present

## 2016-07-18 DIAGNOSIS — H65191 Other acute nonsuppurative otitis media, right ear: Secondary | ICD-10-CM

## 2016-07-18 LAB — GLUCOSE, CAPILLARY: Glucose-Capillary: 101 mg/dL — ABNORMAL HIGH (ref 65–99)

## 2016-07-18 MED ORDER — PREDNISONE 50 MG PO TABS: 50.0000 mg | ORAL_TABLET | Freq: Every day | ORAL | 0 refills | Status: DC

## 2016-07-18 MED ORDER — TRIAMCINOLONE ACETONIDE 55 MCG/ACT NA AERO: 2.0000 | INHALATION_SPRAY | Freq: Every day | NASAL | 0 refills | Status: DC

## 2016-07-18 MED ORDER — AZITHROMYCIN 250 MG PO TABS: 250.0000 mg | ORAL_TABLET | Freq: Every day | ORAL | 0 refills | Status: DC

## 2016-07-18 NOTE — ED Notes (Signed)
Pt given peanut butter and graham crackers  

## 2016-07-18 NOTE — Discharge Instructions (Addendum)
Dizziness today may be related to combination of not eating significantly today and sinus/ear infection.  ECG without acute changes.  Prescriptions for prednisone, nasal steroid spray, and zithromax (antibiotic) were sent to the [facility] on [address].  Note for work.  Push fluids, rest.  Eat something with protein every day when you get up.  Recheck for new fever >100.5, increasing phlegm production/nasal discharge or if not starting to improve in a few days.  Anticipate gradual improvement over the next few days.

## 2016-07-18 NOTE — ED Triage Notes (Signed)
[facility] with a complaint of dizziness, lightheadedness and SOB that started this am.

## 2016-07-18 NOTE — ED Provider Notes (Signed)
[facility]    CSN: [id number] Arrival date & time: 07/18/16  1624     History   Chief Complaint Chief Complaint  Patient presents with  . Dizziness    HPI Brian Mclean is a 31 y.o. male. He presents today with lightheadedness/presyncope that occurred at work today.  Has not had this before.  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  He had runny/congested nose last week, some cough, but starting to feel better.  No sore throat.  Nausea, no vomiting.  Stools a little soft but no diarrhea.  No abd pain, no urinary symptoms.  Tactile temp.  Headaches.  Ate an orange about 9am today, nothing since.   HPI  History reviewed. No pertinent past medical history.   History reviewed. No pertinent surgical history.     Home Medications   Takes no meds regularly   Family History Family History  Problem Relation Age of Onset  . Hypertension Other   . Stroke Other   . CAD Other   . Diabetes Other   . Asthma Other     Social History Social History  Substance Use Topics  . Smoking status: Current Every Day Smoker    Types: Cigarettes  . Smokeless tobacco: Not on file  . Alcohol use No     Allergies   Penicillins   Review of Systems Review of Systems  All other systems reviewed and are negative.    Physical Exam Triage Vital Signs ED Triage Vitals  Enc Vitals Group     BP 07/18/16 1640 (!) 133/93     Pulse Rate 07/18/16 1640 (!) 113     Resp 07/18/16 1640 20     Temp 07/18/16 1640 99.5 F (37.5 C)     Temp Source 07/18/16 1640 Oral     SpO2 07/18/16 1640 97 %     Weight --      Height --      Pain Score 07/18/16 1639 0     Pain Loc --    Updated Vital Signs BP (!) 133/93 (BP Location: Right Arm)   Pulse (!) 113   Temp 99.5 F (37.5 C) (Oral)   Resp 20   SpO2 97%   Physical Exam  Constitutional: He is oriented to person, place, and time. No distress.  Alert, nicely groomed Sitting up on exam table.  Sounds very congested.  HENT:  Head:  Atraumatic.  R TM red/dull; L TM mod dull, no erythema Mod nasal congestion Throat red  Eyes:  Conjugate gaze, no eye redness/drainage  Neck: Neck supple.  Cardiovascular: Regular rhythm.   HR 110s  Pulmonary/Chest: No respiratory distress. He has no wheezes. He has no rales.  Lungs clear, symmetric breath sounds  Abdominal: He exhibits no distension.  Musculoskeletal: Normal range of motion.  Neurological: He is alert and oriented to person, place, and time.  Skin: Skin is warm and dry.  No cyanosis  Nursing note and vitals reviewed.    UC Treatments / Results   Procedures Procedures (including critical care time) ECG:  No acute ST or T wave changes, sinus rhythm without dysrhythmia  Final Clinical Impressions(s) / UC Diagnoses   Final diagnoses:  Dizziness  Sinus congestion  Acute right otitis media   Dizziness today may be related to combination of not eating significantly today and sinus/ear infection.  ECG without acute changes.  Prescriptions for prednisone, nasal steroid spray, and zithromax (antibiotic) [facility] on [address].  Note for work.  Push fluids, rest.  Eat something with protein every day when you get up.  Recheck for new fever >100.5, increasing phlegm production/nasal discharge or if not starting to improve in a few days.  Anticipate gradual improvement over the next few days.    New Prescriptions Discharge Medication List as of 07/18/2016  6:33 PM    START taking these medications   Details  azithromycin (ZITHROMAX) 250 MG tablet Take 1 tablet (250 mg total) by mouth daily. Take first 2 tablets together, then 1 every day until finished., Starting Wed 07/18/2016, Normal    predniSONE (DELTASONE) 50 MG tablet Take 1 tablet (50 mg total) by mouth daily., Starting Wed 07/18/2016, Normal    triamcinolone (NASACORT AQ) 55 MCG/ACT AERO nasal inhaler Place 2 sprays into the nose daily., Starting Wed 07/18/2016, Normal         Eustace Moore, MD 07/22/16 2106

## 2016-10-01 ENCOUNTER — Encounter (HOSPITAL_COMMUNITY): Payer: Self-pay | Admitting: Emergency Medicine

## 2016-10-01 DIAGNOSIS — Z79899 Other long term (current) drug therapy: Secondary | ICD-10-CM | POA: Insufficient documentation

## 2016-10-01 DIAGNOSIS — M5441 Lumbago with sciatica, right side: Secondary | ICD-10-CM | POA: Diagnosis not present

## 2016-10-01 DIAGNOSIS — F17210 Nicotine dependence, cigarettes, uncomplicated: Secondary | ICD-10-CM | POA: Diagnosis not present

## 2016-10-01 DIAGNOSIS — M545 Low back pain: Secondary | ICD-10-CM | POA: Diagnosis present

## 2016-10-01 NOTE — ED Triage Notes (Signed)
Pt reports back pain that has been ongoing for the last 2 weeks with radiation to right leg. Denies any urinary symptoms.

## 2016-10-02 ENCOUNTER — Emergency Department (HOSPITAL_COMMUNITY)
Admission: EM | Admit: 2016-10-02 | Discharge: 2016-10-02 | Disposition: A | Discharge status: Home / Self Care | Payer: BLUE CROSS/BLUE SHIELD | Attending: Emergency Medicine | Admitting: Emergency Medicine

## 2016-10-02 DIAGNOSIS — M5441 Lumbago with sciatica, right side: Secondary | ICD-10-CM

## 2016-10-02 MED ORDER — METHYLPREDNISOLONE 4 MG PO TBPK: ORAL_TABLET | ORAL | 0 refills | Status: DC

## 2016-10-02 MED ORDER — KETOROLAC TROMETHAMINE 30 MG/ML IJ SOLN
30.0000 mg | Freq: Once | INTRAMUSCULAR | Status: AC
  Administered 2016-10-02: 30 mg via INTRAMUSCULAR
  Filled 2016-10-02: qty 1

## 2016-10-02 MED ORDER — CYCLOBENZAPRINE HCL 10 MG PO TABS: 10.0000 mg | ORAL_TABLET | Freq: Two times a day (BID) | ORAL | 0 refills | Status: DC | PRN

## 2016-10-02 NOTE — ED Provider Notes (Signed)
[facility] Provider Note   CSN: [id number] Arrival date & time: 10/01/16  2026     History   Chief Complaint Chief Complaint  Patient presents with  . Back Pain    HPI Brian Mclean is a 31 y.o. male.  HPI 31 year old African-American male with no significant past medical history presents to the emergency Department today with complaints of low back pain that radiates to his right leg. The patient states that he does heavy lifting and repetitive bending over. Pain is been there for 2 weeks and has progressively worsened. Moving makes the pain worse. Lying down makes the pain better. He reports intermittent associated paresthesias to the right leg that self resolved. Patient denies any injury. Denies any loss of bowel or bladder, urinary retention, saddle paresthesias, urinary symptoms, fever, chills, nausea, vomiting. Patient has not tried anything for his symptoms except low back stretches. History reviewed. No pertinent past medical history.  There are no active problems to display for this patient.   History reviewed. No pertinent surgical history.     Home Medications    Prior to Admission medications   Medication Sig Start Date End Date Taking? Authorizing Provider  azithromycin (ZITHROMAX) 250 MG tablet Take 1 tablet (250 mg total) by mouth daily. Take first 2 tablets together, then 1 every day until finished. Patient not taking: Reported on 10/02/2016 07/18/16   Eustace Moore, MD  cyclobenzaprine (FLEXERIL) 10 MG tablet Take 1 tablet (10 mg total) by mouth 2 (two) times daily as needed for muscle spasms. 10/02/16   Rise Mu, PA-C  methylPREDNISolone (MEDROL DOSEPAK) 4 MG TBPK tablet Take as prescribed 10/02/16   Demetrios Loll T, PA-C  predniSONE (DELTASONE) 50 MG tablet Take 1 tablet (50 mg total) by mouth daily. Patient not taking: Reported on 10/02/2016 07/18/16   Eustace Moore, MD  triamcinolone (NASACORT AQ) 55 MCG/ACT AERO nasal inhaler  Place 2 sprays into the nose daily. Patient not taking: Reported on 10/02/2016 07/18/16   Eustace Moore, MD    Family History Family History  Problem Relation Age of Onset  . Hypertension Other   . Stroke Other   . CAD Other   . Diabetes Other   . Asthma Other     Social History Social History  Substance Use Topics  . Smoking status: Current Every Day Smoker    Types: Cigarettes  . Smokeless tobacco: Not on file  . Alcohol use No     Allergies   Penicillins   Review of Systems Review of Systems  Constitutional: Negative for chills and fever.  Gastrointestinal: Negative for diarrhea, nausea and vomiting.  Genitourinary: Negative for frequency, hematuria and urgency.  Musculoskeletal: Positive for back pain. Negative for gait problem, neck pain and neck stiffness.     Physical Exam Updated Vital Signs BP 130/80 (BP Location: Left Arm)   Pulse 76   Temp 98.8 F (37.1 C) (Oral)   Resp 14   Ht 6' (1.829 m)   Wt 129.7 kg (286 lb)   SpO2 99%   BMI 38.79 kg/m   Physical Exam  Constitutional: He is oriented to person, place, and time. He appears well-developed and well-nourished. No distress.  Eyes: Right eye exhibits no discharge. Left eye exhibits no discharge. No scleral icterus.  Cardiovascular: Intact distal pulses.   Pulmonary/Chest: No respiratory distress.  Musculoskeletal: Normal range of motion.  Bilateral paraspinal tenderness. No midline L-spine or T-spine tenderness. No deformities or step-offs noted. Full range  of motion. Positive straight leg raise test, right that reproduces pain and paresthesias. Strength 5 out of 5 in lower extremities. Sensation intact to sharp/dull. DP pulses are 2+ bilaterally. Cap refill normal.  Neurological: He is alert and oriented to person, place, and time.  Skin: Skin is warm and dry. Capillary refill takes less than 2 seconds. No pallor.  Nursing note and vitals reviewed.    ED Treatments / Results  Labs (all labs  ordered are listed, but only abnormal results are displayed) Labs Reviewed - No data to display  EKG  EKG Interpretation None       Radiology No results found.  Procedures Procedures (including critical care time)  Medications Ordered in ED Medications  ketorolac (TORADOL) 30 MG/ML injection 30 mg (30 mg Intramuscular Given 10/02/16 0222)     Initial Impression / Assessment and Plan / ED Course  I have reviewed the triage vital signs and the nursing notes.  Pertinent labs & imaging results that were available during my care of the patient were reviewed by me and considered in my medical decision making (see chart for details).     Patient with back pain.  No neurological deficits and normal neuro exam.  Patient can walk but states is painful.  No loss of bowel or bladder control.  No concern for cauda equina.  No fever, night sweats, weight loss, h/o cancer, IVDU.  RICE protocol and pain medicine indicated and discussed with patient.    Final Clinical Impressions(s) / ED Diagnoses   Final diagnoses:  Acute bilateral low back pain with right-sided sciatica    New Prescriptions Discharge Medication List as of 10/02/2016  2:18 AM    START taking these medications   Details  cyclobenzaprine (FLEXERIL) 10 MG tablet Take 1 tablet (10 mg total) by mouth 2 (two) times daily as needed for muscle spasms., Starting Tue 10/02/2016, Print    methylPREDNISolone (MEDROL DOSEPAK) 4 MG TBPK tablet Take as prescribed, Print         Wallace KellerLeaphart, Kenneth T, PA-C 10/02/16 0635    Paula LibraMolpus, John, MD 10/02/16 [phone]

## 2016-10-02 NOTE — Discharge Instructions (Signed)
Please take the Flexeril at night for muscle relaxation. Take the steroid pack as prescribed. Make sure you're altering Motrin and Tylenol. Warm compresses to the low back. Warm soaks in Epsom salt. Avoid heavy lifting and repetitive bending over. If symptoms are not improving in the next week follow-up with orthopedics. Return to the ED for any worsening symptoms.

## 2016-10-29 ENCOUNTER — Encounter (HOSPITAL_COMMUNITY): Payer: Self-pay | Admitting: Emergency Medicine

## 2016-10-29 ENCOUNTER — Emergency Department (HOSPITAL_COMMUNITY)
Admission: EM | Admit: 2016-10-29 | Discharge: 2016-10-29 | Disposition: A | Discharge status: Home / Self Care | Payer: BLUE CROSS/BLUE SHIELD | Attending: Emergency Medicine | Admitting: Emergency Medicine

## 2016-10-29 ENCOUNTER — Emergency Department (HOSPITAL_COMMUNITY): Payer: BLUE CROSS/BLUE SHIELD

## 2016-10-29 DIAGNOSIS — F17210 Nicotine dependence, cigarettes, uncomplicated: Secondary | ICD-10-CM | POA: Insufficient documentation

## 2016-10-29 DIAGNOSIS — N4889 Other specified disorders of penis: Secondary | ICD-10-CM | POA: Diagnosis not present

## 2016-10-29 DIAGNOSIS — N481 Balanitis: Secondary | ICD-10-CM

## 2016-10-29 LAB — URINALYSIS, ROUTINE W REFLEX MICROSCOPIC
BILIRUBIN URINE: NEGATIVE
GLUCOSE, UA: NEGATIVE mg/dL
Ketones, ur: NEGATIVE mg/dL
Nitrite: NEGATIVE
PROTEIN: NEGATIVE mg/dL
SPECIFIC GRAVITY, URINE: 1.015 (ref 1.005–1.030)
pH: 5 (ref 5.0–8.0)

## 2016-10-29 MED ORDER — SULFAMETHOXAZOLE-TRIMETHOPRIM 800-160 MG PO TABS: 1.0000 | ORAL_TABLET | Freq: Two times a day (BID) | ORAL | 0 refills | Status: AC

## 2016-10-29 MED ORDER — NYSTATIN 100000 UNIT/GM EX CREA: TOPICAL_CREAM | CUTANEOUS | 0 refills | Status: DC

## 2016-10-29 NOTE — ED Notes (Signed)
Chaperoned provider with assess the penis.

## 2016-10-29 NOTE — Discharge Instructions (Signed)
Please schedule an appointment as soon as possible for a follow-up visit with urology.  Bactrim is on the $4 list at [facility].  Please take Ibuprofen (Advil, motrin) and Tylenol (acetaminophen) to relieve your pain.  You may take up to 800 MG (4 pills) of normal strength ibuprofen every 8 hours as needed.  In between doses of ibuprofen you make take tylenol, up to 1,000 mg (two extra strength pills).  Do not take more than 3,000 mg tylenol in a 24 hour period.  Please check all medication labels as many medications such as pain and cold medications may contain tylenol.  Do not drink alcohol while taking these medications.  Do not take other NSAID'S while taking ibuprofen (such as aleve or naproxen).  Please take ibuprofen with food to decrease stomach upset.  Please monitor yourself for the warning signs and symptoms that we discussed. Please return to the emergency room if you worsen or have any concerns.

## 2016-10-29 NOTE — ED Triage Notes (Signed)
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 Patient states has swelling in penis but denies any pain with urination or discharge.

## 2016-10-29 NOTE — ED Provider Notes (Signed)
[facility] Provider Note   CSN: [id number] Arrival date & time: 10/29/16  1435   By signing my name below, I, Clarisse Gouge, attest that this documentation has been prepared under the direction and in the presence of Lyndel Safe, PA-C. Electronically Signed: Clarisse Gouge, Scribe. 10/29/16. 4:14 PM.   History   Chief Complaint Chief Complaint  Patient presents with  . Groin Swelling   The history is provided by the patient and medical records. No language interpreter was used.    Brian Mclean is a 31 y.o. male presenting to the Emergency Department concerning penile swelling the pt noticed this morning after a shower. Penile pain with palpation and penile discharge associated. He described 9/10 constant discomfort that is worse with contact and palpation. Last sexual encounter reportedly 2-3 weeks ago with a woman, barrier allegedly used at this time. Patient states that he is unsure if he is circumcised or not. H/o trich reported 5-6 years ago. No recent trauma/ injury. No testicle pain, dysuria, difficulty urinating, abdominal pain, fever or chills. No other complaints at this time.   History reviewed. No pertinent past medical history.  There are no active problems to display for this patient.   History reviewed. No pertinent surgical history.     Home Medications    Prior to Admission medications   Medication Sig Start Date End Date Taking? Authorizing Provider  azithromycin (ZITHROMAX) 250 MG tablet Take 1 tablet (250 mg total) by mouth daily. Take first 2 tablets together, then 1 every day until finished. Patient not taking: Reported on 10/02/2016 07/18/16   Eustace Moore, MD  cyclobenzaprine (FLEXERIL) 10 MG tablet Take 1 tablet (10 mg total) by mouth 2 (two) times daily as needed for muscle spasms. 10/02/16   Rise Mu, PA-C  methylPREDNISolone (MEDROL DOSEPAK) 4 MG TBPK tablet Take as prescribed 10/02/16   Demetrios Loll T, PA-C  nystatin  cream (MYCOSTATIN) Apply to affected area 2 times daily 10/29/16   Cristina Gong, PA-C  predniSONE (DELTASONE) 50 MG tablet Take 1 tablet (50 mg total) by mouth daily. Patient not taking: Reported on 10/02/2016 07/18/16   Eustace Moore, MD  sulfamethoxazole-trimethoprim (BACTRIM DS,SEPTRA DS) 800-160 MG tablet Take 1 tablet by mouth 2 (two) times daily. 10/29/16 11/05/16  Cristina Gong, PA-C  triamcinolone (NASACORT AQ) 55 MCG/ACT AERO nasal inhaler Place 2 sprays into the nose daily. Patient not taking: Reported on 10/02/2016 07/18/16   Eustace Moore, MD    Family History Family History  Problem Relation Age of Onset  . Hypertension Other   . Stroke Other   . CAD Other   . Diabetes Other   . Asthma Other     Social History Social History  Substance Use Topics  . Smoking status: Current Every Day Smoker    Types: Cigarettes  . Smokeless tobacco: Never Used  . Alcohol use No     Allergies   Penicillins   Review of Systems Review of Systems  Constitutional: Negative for chills and fever.  HENT: Negative for congestion.   Eyes: Negative for photophobia and visual disturbance.  Respiratory: Negative for cough and shortness of breath.   Cardiovascular: Negative for chest pain.  Gastrointestinal: Negative for abdominal pain, nausea and vomiting.  Genitourinary: Positive for discharge, penile pain and penile swelling. Negative for decreased urine volume, difficulty urinating, dysuria, frequency, genital sores, scrotal swelling, testicular pain and urgency.  Musculoskeletal: Negative for back pain and neck pain.  Skin: Negative  for wound.  Neurological: Negative for weakness and numbness.     Physical Exam Updated Vital Signs BP (!) 134/102 (BP Location: Left Arm)   Pulse 85   Temp 98 F (36.7 C) (Oral)   Resp 18   SpO2 97%   Physical Exam  Constitutional: He appears well-developed and well-nourished.  HENT:  Head: Normocephalic and atraumatic.  Eyes:  Conjunctivae are normal.  Neck: Neck supple.  Cardiovascular: Normal rate, regular rhythm and intact distal pulses.   No murmur heard. Pulmonary/Chest: Effort normal. No respiratory distress.  Abdominal: Soft. There is no tenderness.  Genitourinary: Testes normal. Right testis shows no swelling and no tenderness. Left testis shows no swelling and no tenderness.  Genitourinary Comments: GU exam performed with chaperone present.  Penis is flaccid with obvious swelling to the right shaft. There is a fluctuant area, however unable to differentiate edema versus abscess.  On the ventral aspect of the penis midline there is a area of skin breakdown with questionable skin drainage versus urethral drainage.  The glans penis is well perfused.  Swelling is not circumferential.    Musculoskeletal: He exhibits no edema.  Lymphadenopathy:       Right: No inguinal adenopathy present.       Left: No inguinal adenopathy present.  Neurological: He is alert.  Skin: Skin is warm and dry. No rash noted.  Psychiatric: He has a normal mood and affect. His behavior is normal.  Nursing note and vitals reviewed.    ED Treatments / Results  DIAGNOSTIC STUDIES: Oxygen Saturation is 97% on RA, NL by my interpretation.    COORDINATION OF CARE: 3:58 PM-Discussed next steps with pt. Pt verbalized understanding and is agreeable with the plan. Pt prepared for penile exam; will order STI panel.   Labs (all labs ordered are listed, but only abnormal results are displayed) Labs Reviewed  URINALYSIS, ROUTINE W REFLEX MICROSCOPIC - Abnormal; Notable for the following:       Result Value   Hgb urine dipstick SMALL (*)    Leukocytes, UA LARGE (*)    Bacteria, UA MANY (*)    Squamous Epithelial / LPF 0-5 (*)    All other components within normal limits  RPR  HIV ANTIBODY (ROUTINE TESTING)  GC/CHLAMYDIA PROBE AMP ([place]) NOT AT [facility]    EKG  EKG Interpretation None       Radiology [place] Misc Soft  Tissue  Result Date: 10/29/2016 CLINICAL DATA:  31 y/o M; penile area of pain and swelling. Concern for abscess. EXAM: ULTRASOUND OF PENIS TECHNIQUE: Ultrasound examination of the right-sided penis soft tissues was performed in the area of clinical concern. COMPARISON:  None. FINDINGS: In the area of interest on the right-sided penis there is a ill-defined focus of mixed echogenicity measuring up to 1.2 cm centered deep to the dermis. There are foci of fluid within the lesion. The lesion demonstrates a few foci of internal vascularity on color Doppler. There is thickening of the overlying skin likely representing edema. IMPRESSION: Heterogeneous lesion in the right side of penis measuring 1.2 cm deep to the dermis with internal vascularity. Edema in the overlying skin. Given pain and swelling this probably represents a phlegmon, sebaceous cyst, or granuloma. Follow-up to resolution is recommended to exclude a neoplastic process as the lesion demonstrates internal vascularity. Electronically Signed   By: Mitzi Hansen M.D.   On: 10/29/2016 18:22    Procedures Procedures (including critical care time)  Medications Ordered in ED Medications - No  data to display   Initial Impression / Assessment and Plan / ED Course  I have reviewed the triage vital signs and the nursing notes.  Pertinent labs & imaging results that were available during my care of the patient were reviewed by me and considered in my medical decision making (see chart for details).    Brian Mclean presents with right sided penile shaft swelling and pain.  He denies any trauma to the area, is sexually active with male partners, however reports that he always uses a condom.  Dr. Particia NearingHaviland was involved in the patient's care and assisted and development of workup and treatment plan.  I suspect that patients condition is balanitis with secondary bacterial infection.  No obvious paraphimosis. Patient agreed to full STI testing  but refused empiric treatment at this time.  Patient is aware he has test for gonorrhea and chlamydia, along with HIV and syphilis pending. Patient was given strict discharge precautions and given the option to ask questions, all of which were answered to the best of my ability.  Patient voiced his understanding of discharge precautions.  He was instructed to follow up with urology regarding his swelling and symptoms.  Will treat secondary infection with bactrim and balanitis with nystatin cream.    At this time there does not appear to be any evidence of an acute emergency medical condition and the patient appears stable for discharge with appropriate outpatient follow up.  Diagnosis was discussed with patient who verbalizes understanding and is agreeable to discharge. Pt case discussed with Dr. Particia NearingHaviland who agrees with my plan.    Final Clinical Impressions(s) / ED Diagnoses   Final diagnoses:  Penile swelling  Balanitis    New Prescriptions Discharge Medication List as of 10/29/2016  7:06 PM    START taking these medications   Details  nystatin cream (MYCOSTATIN) Apply to affected area 2 times daily, Print    sulfamethoxazole-trimethoprim (BACTRIM DS,SEPTRA DS) 800-160 MG tablet Take 1 tablet by mouth 2 (two) times daily., Starting Mon 10/29/2016, Until Mon 11/05/2016, Print      I personally performed the services described in this documentation, which was scribed in my presence. The recorded information has been reviewed and is accurate.    Norman ClayHammond, Lamaya Hyneman W, PA-C 10/29/16 2157    Jacalyn LefevreHaviland, Julie, MD 10/29/16 [phone]

## 2016-10-30 LAB — GC/CHLAMYDIA PROBE AMP (~~LOC~~) NOT AT ARMC
Chlamydia: NEGATIVE
Neisseria Gonorrhea: NEGATIVE

## 2016-10-30 LAB — RPR: RPR: NONREACTIVE

## 2016-10-30 LAB — HIV ANTIBODY (ROUTINE TESTING W REFLEX): HIV SCREEN 4TH GENERATION: NONREACTIVE

## 2017-01-17 ENCOUNTER — Encounter (HOSPITAL_COMMUNITY): Payer: Self-pay | Admitting: Emergency Medicine

## 2017-01-17 ENCOUNTER — Ambulatory Visit (HOSPITAL_COMMUNITY)
Admission: EM | Admit: 2017-01-17 | Discharge: 2017-01-17 | Disposition: A | Discharge status: Home / Self Care | Payer: BLUE CROSS/BLUE SHIELD | Attending: Family Medicine | Admitting: Family Medicine

## 2017-01-17 DIAGNOSIS — S39012A Strain of muscle, fascia and tendon of lower back, initial encounter: Secondary | ICD-10-CM | POA: Diagnosis not present

## 2017-01-17 DIAGNOSIS — M545 Low back pain, unspecified: Secondary | ICD-10-CM

## 2017-01-17 MED ORDER — METHYLPREDNISOLONE 4 MG PO TBPK: ORAL_TABLET | ORAL | 0 refills | Status: DC

## 2017-01-17 MED ORDER — CYCLOBENZAPRINE HCL 10 MG PO TABS: 10.0000 mg | ORAL_TABLET | Freq: Three times a day (TID) | ORAL | 0 refills | Status: DC | PRN

## 2017-01-17 NOTE — ED Triage Notes (Addendum)
Pt reports that this is the second time he is having pain in his lower back that radiates through his right hip and down his right leg.  Pt was seen in the ED a few months ago and treated with medication at home.  Pt states the medication helped at the time but it has since returned, last weekend.  Pt states he has been doing the stretches he was prescribed to do back then.

## 2017-01-17 NOTE — Discharge Instructions (Signed)
Apply heat to the muscles that hurt. Perform gentle stretches as directed. Follow-up with the occupational health next week before returning to full duty. No heavy lifting or bending or pulling. Medications as directed. May cause drowsiness.

## 2017-01-17 NOTE — ED Provider Notes (Signed)
[facility]    CSN: [id number] Arrival date & time: 01/17/17  1045     History   Chief Complaint Chief Complaint  Patient presents with  . Back Pain    HPI Brian Mclean is a 32 y.o. male.   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 This is his third visit since last year for the same complaint. He was seen in July emergency department for same complaint and similar evaluation. 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 States he has been doing stretches. The pain started around 3 days ago. It is worse with almost any type of movement, rising from sitting position, sitting from standing position, bending over and ambulation. Denies any trauma. No single event that caused pain. Denies focal paresthesias or weakness, some subtle paresthesias, urinary incontinence, nausea or vomiting, fever.      History reviewed. No pertinent past medical history.  There are no active problems to display for this patient.   History reviewed. No pertinent surgical history.     Home Medications    Prior to Admission medications   Medication Sig Start Date End Date Taking? Authorizing Provider  cyclobenzaprine (FLEXERIL) 10 MG tablet Take 1 tablet (10 mg total) by mouth 3 (three) times daily as needed for muscle spasms. 01/17/17   Hayden Rasmussen, NP  methylPREDNISolone (MEDROL DOSEPAK) 4 MG TBPK tablet follow package directions 01/17/17   Hayden Rasmussen, NP  nystatin cream (MYCOSTATIN) Apply to affected area 2 times daily 10/29/16   Cristina Gong, PA-C    Family History Family History  Problem Relation Age of Onset  . Hypertension Other   . Stroke Other   . CAD Other   . Diabetes Other   . Asthma Other     Social History Social History  Substance Use Topics  . Smoking status: Former Smoker    Types: Cigarettes     Quit date: 12/27/2016  . Smokeless tobacco: Never Used  . Alcohol use No     Allergies   Penicillins   Review of Systems Review of Systems  Constitutional: Negative.   Respiratory: Negative.   Gastrointestinal: Negative.   Genitourinary: Negative.   Musculoskeletal: Positive for back pain and myalgias.       As per HPI  Skin: Negative.   Neurological: Negative for dizziness, weakness, numbness and headaches.  All other systems reviewed and are negative.    Physical Exam Triage Vital Signs ED Triage Vitals  Enc Vitals Group     BP 01/17/17 1207 (!) 141/96     Pulse Rate 01/17/17 1207 71     Resp --      Temp 01/17/17 1207 97.8 F (36.6 C)     Temp Source 01/17/17 1207 Oral     SpO2 01/17/17 1207 97 %     Weight --      Height --      Head Circumference --      Peak Flow --      Pain Score 01/17/17 1205 9     Pain Loc --      Pain Edu? --      Excl. in GC? --    No data found.   Updated Vital Signs BP (!) 141/96 (BP Location: Left Arm)   Pulse 71  Temp 97.8 F (36.6 C) (Oral)   SpO2 97%   Visual Acuity Right Eye Distance:   Left Eye Distance:   Bilateral Distance:    Right Eye Near:   Left Eye Near:    Bilateral Near:     Physical Exam  Constitutional: He is oriented to person, place, and time. He appears well-developed and well-nourished. No distress.  Neck: Neck supple.  Cardiovascular: Normal rate.   Pulmonary/Chest: Effort normal.  Musculoskeletal: He exhibits no edema or deformity.  TTP to the bilateral para lumbosacral musculature as well as the right thigh musculature. Able to bear weight on both left and right lower extremities. Ambulatory. No spinal tenderness or step-off deformities. No swelling or discolorations.  Neurological: He is alert and oriented to person, place, and time.  Skin: Skin is warm.  Psychiatric: He has a normal mood and affect.  Nursing note and vitals reviewed.    UC Treatments / Results  Labs (all labs  ordered are listed, but only abnormal results are displayed) Labs Reviewed - No data to display  EKG  EKG Interpretation None       Radiology No results found.  Procedures Procedures (including critical care time)  Medications Ordered in UC Medications - No data to display   Initial Impression / Assessment and Plan / UC Course  I have reviewed the triage vital signs and the nursing notes.  Pertinent labs & imaging results that were available during my care of the patient were reviewed by me and considered in my medical decision making (see chart for details).    Apply heat to the muscles that hurt. Perform gentle stretches as directed. Follow-up with the occupational health next week before returning to full duty. No heavy lifting or bending or pulling. Medications as directed. May cause drowsiness.     Final Clinical Impressions(s) / UC Diagnoses   Final diagnoses:  Strain of lumbar region, initial encounter  Acute bilateral low back pain without sciatica    New Prescriptions New Prescriptions   CYCLOBENZAPRINE (FLEXERIL) 10 MG TABLET    Take 1 tablet (10 mg total) by mouth 3 (three) times daily as needed for muscle spasms.   METHYLPREDNISOLONE (MEDROL DOSEPAK) 4 MG TBPK TABLET    follow package directions     Controlled Substance Prescriptions [place] Controlled Substance Registry consulted? Not Applicable   Hayden Rasmussen, NP 01/17/17 [phone]

## 2017-01-22 ENCOUNTER — Ambulatory Visit (HOSPITAL_COMMUNITY)
Admission: EM | Admit: 2017-01-22 | Discharge: 2017-01-22 | Disposition: A | Discharge status: Home / Self Care | Payer: BLUE CROSS/BLUE SHIELD | Attending: Family Medicine | Admitting: Family Medicine

## 2017-01-22 ENCOUNTER — Encounter (HOSPITAL_COMMUNITY): Payer: Self-pay | Admitting: Emergency Medicine

## 2017-01-22 DIAGNOSIS — M5441 Lumbago with sciatica, right side: Secondary | ICD-10-CM | POA: Diagnosis present

## 2017-01-22 DIAGNOSIS — Z8249 Family history of ischemic heart disease and other diseases of the circulatory system: Secondary | ICD-10-CM | POA: Diagnosis not present

## 2017-01-22 DIAGNOSIS — Z87891 Personal history of nicotine dependence: Secondary | ICD-10-CM | POA: Insufficient documentation

## 2017-01-22 DIAGNOSIS — M5440 Lumbago with sciatica, unspecified side: Secondary | ICD-10-CM | POA: Diagnosis not present

## 2017-01-22 DIAGNOSIS — G8929 Other chronic pain: Secondary | ICD-10-CM | POA: Diagnosis present

## 2017-01-22 LAB — POCT URINALYSIS DIP (DEVICE)
Bilirubin Urine: NEGATIVE
GLUCOSE, UA: NEGATIVE mg/dL
Ketones, ur: NEGATIVE mg/dL
LEUKOCYTES UA: NEGATIVE
NITRITE: NEGATIVE
Protein, ur: NEGATIVE mg/dL
Specific Gravity, Urine: 1.015 (ref 1.005–1.030)
UROBILINOGEN UA: 0.2 mg/dL (ref 0.0–1.0)
pH: 6 (ref 5.0–8.0)

## 2017-01-22 MED ORDER — CIPROFLOXACIN HCL 500 MG PO TABS: 500.0000 mg | ORAL_TABLET | Freq: Two times a day (BID) | ORAL | 0 refills | Status: DC

## 2017-01-22 MED ORDER — DICLOFENAC SODIUM 75 MG PO TBEC: 75.0000 mg | DELAYED_RELEASE_TABLET | Freq: Two times a day (BID) | ORAL | 1 refills | Status: DC

## 2017-01-22 NOTE — ED Triage Notes (Signed)
Patient seen 9/27 for lower back pain, predominantly on the right.  Back pain is improved.

## 2017-01-22 NOTE — ED Notes (Signed)
Sent to bathroom for dirty and clean urine.

## 2017-01-22 NOTE — ED Provider Notes (Signed)
[facility]   [id number] 01/22/17 Arrival Time: 1302   SUBJECTIVE:  Brian Mclean is a 31 y.o. male who presents to the urgent care with complaint of right low lumbar pain, acute and chronic.  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  Each episode seems to resolve after a few days and NSAID's.    He has had worsening pain for a week, and was seen 5 days ago.  He has had only slight improvement since and his company sent him home yesterday and today.     History reviewed. No pertinent past medical history. Family History  Problem Relation Age of Onset  . Hypertension Other   . Stroke Other   . CAD Other   . Diabetes Other   . Asthma Other    Social History   Social History  . Marital status: Single    Spouse name: N/A  . Number of children: N/A  . Years of education: N/A   Occupational History  . Not on file.   Social History Main Topics  . Smoking status: Former Smoker    Types: Cigarettes    Quit date: 12/27/2016  . Smokeless tobacco: Never Used  . Alcohol use No  . Drug use: No  . Sexual activity: Not on file   Other Topics Concern  . Not on file   Social History Narrative  . No narrative on file   Current Meds  Medication Sig  . [DISCONTINUED] cyclobenzaprine (FLEXERIL) 10 MG tablet Take 1 tablet (10 mg total) by mouth 3 (three) times daily as needed for muscle spasms.   Allergies  Allergen Reactions  . Penicillins Swelling    Has patient had a PCN reaction causing immediate rash, facial/tongue/throat swelling, SOB or lightheadedness with hypotension: Unknown Has patient had a PCN reaction causing severe rash involving mucus membranes or skin necrosis: Unknown Has patient had a PCN reaction that required hospitalization: Unknown Has patient had a PCN reaction occurring within the last 10 years: No If all of the above answers are "NO", then may proceed with Cephalosporin use.       ROS: As per HPI, remainder  of ROS negative.   OBJECTIVE:   Vitals:   01/22/17 1331  BP: 140/78  Pulse: 84  Resp: (!) 22  Temp: 98.1 F (36.7 C)  TempSrc: Oral  SpO2: 100%     General appearance: alert; no distress Eyes: PERRL; EOMI; conjunctiva normal HENT: normocephalic; atraumatic; TMs normal, canal normal, external ears normal without trauma; nasal mucosa normal; oral mucosa normal Neck: supple Back: no CVA tenderness, tender midline and just to right of L5 with weakly positive SLR Extremities: no cyanosis or edema; symmetrical with no gross deformities Skin: warm and dry Neurologic: normal gait; grossly normal Psychological: alert and cooperative; normal mood and affect      Labs:  Results for orders placed or performed during the hospital encounter of 01/22/17  POCT urinalysis dip (device)  Result Value Ref Range   Glucose, UA NEGATIVE NEGATIVE mg/dL   Bilirubin Urine NEGATIVE NEGATIVE   Ketones, ur NEGATIVE NEGATIVE mg/dL   Specific Gravity, Urine 1.015 1.005 - 1.030   Hgb urine dipstick TRACE (A) NEGATIVE   pH 6.0 5.0 - 8.0   Protein, ur NEGATIVE NEGATIVE mg/dL   Urobilinogen, UA 0.2 0.0 - 1.0 mg/dL   Nitrite NEGATIVE NEGATIVE   Leukocytes, UA NEGATIVE NEGATIVE    Labs Reviewed  POCT URINALYSIS DIP (DEVICE) - Abnormal;  Notable for the following:       Result Value   Hgb urine dipstick TRACE (*)    All other components within normal limits  URINE CULTURE  URINE CYTOLOGY ANCILLARY ONLY    No results found.     ASSESSMENT & PLAN:  1. Acute right-sided low back pain with sciatica, sciatica laterality unspecified     Meds ordered this encounter  Medications  . diclofenac (VOLTAREN) 75 MG EC tablet    Sig: Take 1 tablet (75 mg total) by mouth 2 (two) times daily.    Dispense:  14 tablet    Refill:  1  . DISCONTD: ciprofloxacin (CIPRO) 500 MG tablet    Sig: Take 1 tablet (500 mg total) by mouth 2 (two) times daily.    Dispense:  10 tablet    Refill:  0    Reviewed  expectations re: course of current medical issues. Questions answered. Outlined signs and symptoms indicating need for more acute intervention. Patient verbalized understanding. After Visit Summary given.    Procedures:      Elvina Sidle, MD 01/22/17 1429

## 2017-01-23 LAB — URINE CULTURE: Culture: NO GROWTH

## 2017-01-23 LAB — URINE CYTOLOGY ANCILLARY ONLY
Chlamydia: NEGATIVE
Neisseria Gonorrhea: NEGATIVE
Trichomonas: NEGATIVE

## 2018-01-10 ENCOUNTER — Encounter (HOSPITAL_COMMUNITY): Payer: Self-pay

## 2018-01-10 ENCOUNTER — Ambulatory Visit (HOSPITAL_COMMUNITY)
Admission: EM | Admit: 2018-01-10 | Discharge: 2018-01-10 | Disposition: A | Discharge status: Home / Self Care | Payer: BLUE CROSS/BLUE SHIELD | Attending: Family Medicine | Admitting: Family Medicine

## 2018-01-10 DIAGNOSIS — K529 Noninfective gastroenteritis and colitis, unspecified: Secondary | ICD-10-CM | POA: Diagnosis not present

## 2018-01-10 MED ORDER — ONDANSETRON 4 MG PO TBDP
4.0000 mg | ORAL_TABLET | Freq: Once | ORAL | Status: AC
  Administered 2018-01-10: 4 mg via ORAL

## 2018-01-10 MED ORDER — ONDANSETRON 4 MG PO TBDP
ORAL_TABLET | ORAL | Status: AC
  Filled 2018-01-10: qty 1

## 2018-01-10 MED ORDER — ONDANSETRON HCL 4 MG PO TABS: 4.0000 mg | ORAL_TABLET | Freq: Four times a day (QID) | ORAL | 0 refills | Status: DC

## 2018-01-10 NOTE — ED Provider Notes (Signed)
[facility]    CSN: [id number] Arrival date & time: 01/10/18  1342     History   Chief Complaint Chief Complaint  Patient presents with  . Emesis  . Headache  . Generalized Body Aches    HPI Brian Mclean is a 32 y.o. male.   Patient is a 32 year old male that presents with 2 days of headache, vomiting, generalized abdominal discomfort with diarrhea and vomiting.  He has had some watery diarrhea without blood.  He has vomited every time after he eats something.  He denies any recent sick contacts, recent traveling, rashes or fevers.  He denies any history of GI issues in the past.  He has been able to hold down water but unable to eat any food due to vomiting afterwards.  He denies any urinary symptoms, URI symptoms.   ROS per HPI    Emesis  Associated symptoms: headaches   Headache  Associated symptoms: vomiting     History reviewed. No pertinent past medical history.  There are no active problems to display for this patient.   History reviewed. No pertinent surgical history.     Home Medications    Prior to Admission medications   Medication Sig Start Date End Date Taking? Authorizing Provider  diclofenac (VOLTAREN) 75 MG EC tablet Take 1 tablet (75 mg total) by mouth 2 (two) times daily. 01/22/17   Elvina Sidle, MD  nystatin cream (MYCOSTATIN) Apply to affected area 2 times daily 10/29/16   Cristina Gong, PA-C  ondansetron (ZOFRAN) 4 MG tablet Take 1 tablet (4 mg total) by mouth every 6 (six) hours. 01/10/18   Janace Aris, NP    Family History Family History  Problem Relation Age of Onset  . Hypertension Other   . Stroke Other   . CAD Other   . Diabetes Other   . Asthma Other     Social History Social History   Tobacco Use  . Smoking status: Former Smoker    Types: Cigarettes    Last attempt to quit: 12/27/2016    Years since quitting: 1.0  . Smokeless tobacco: Never Used  Substance Use Topics  . Alcohol use: No  . Drug  use: No     Allergies   Penicillins   Review of Systems Review of Systems  Gastrointestinal: Positive for vomiting.  Neurological: Positive for headaches.     Physical Exam Triage Vital Signs ED Triage Vitals  Enc Vitals Group     BP 01/10/18 1400 140/75     Pulse Rate 01/10/18 1400 76     Resp 01/10/18 1400 20     Temp 01/10/18 1400 98.5 F (36.9 C)     Temp Source 01/10/18 1400 Oral     SpO2 01/10/18 1400 94 %     Weight --      Height --      Head Circumference --      Peak Flow --      Pain Score 01/10/18 1402 8     Pain Loc --      Pain Edu? --      Excl. in GC? --    No data found.  Updated Vital Signs BP 140/75 (BP Location: Right Arm)   Pulse 76   Temp 98.5 F (36.9 C) (Oral)   Resp 20   SpO2 94%   Visual Acuity Right Eye Distance:   Left Eye Distance:   Bilateral Distance:    Right Eye Near:  Left Eye Near:    Bilateral Near:     Physical Exam  Constitutional: He appears well-developed and well-nourished.  Very pleasant. Non toxic or ill appearing.     HENT:  Head: Normocephalic and atraumatic.  Neck: Normal range of motion.  Cardiovascular: Normal rate and normal heart sounds.  Pulmonary/Chest: Effort normal.  Lungs clear in all fields. No dyspnea or distress. No retractions or nasal flaring.     Abdominal: Soft. Bowel sounds are normal. He exhibits no distension and no mass. There is no tenderness. There is no guarding.  Abdomen soft, non tender. No CVA tenderness. No rebound tenderness.     Musculoskeletal: Normal range of motion.  Neurological: He is alert.  Skin: Skin is warm and dry.  Nursing note and vitals reviewed.    UC Treatments / Results  Labs (all labs ordered are listed, but only abnormal results are displayed) Labs Reviewed - No data to display  EKG None  Radiology No results found.  Procedures Procedures (including critical care time)  Medications Ordered in UC Medications  ondansetron  (ZOFRAN-ODT) disintegrating tablet 4 mg (4 mg Oral Given 01/10/18 1454)    Initial Impression / Assessment and Plan / UC Course  I have reviewed the triage vital signs and the nursing notes.  Pertinent labs & imaging results that were available during my care of the patient were reviewed by me and considered in my medical decision making (see chart for details).     Most likely viral gastroenteritis.  Exam benign. Vital signs stable.  Patient nontoxic or ill-appearing. Will treat with Zofran.  He is able to hold down fluids.  Instructed to continue staying hydrated and advance diet as tolerated. Follow up as needed for continued or worsening symptoms  Final Clinical Impressions(s) / UC Diagnoses   Final diagnoses:  Gastroenteritis     Discharge Instructions     It was nice meeting you!!  I believe that you have a stomach virus Zofran for nausea Stay hydrated with water and Gatorade Advance diet as tolerated.     ED Prescriptions    Medication Sig Dispense Auth. Provider   ondansetron (ZOFRAN) 4 MG tablet Take 1 tablet (4 mg total) by mouth every 6 (six) hours. 12 tablet Dahlia ByesBast, Mirko Tailor A, NP     Controlled Substance Prescriptions [place] Controlled Substance Registry consulted? Not Applicable   Janace ArisBast, Jonelle Bann A, NP 01/10/18 [phone]

## 2018-01-10 NOTE — Discharge Instructions (Addendum)
It was nice meeting you!!  I believe that you have a stomach virus Zofran for nausea Stay hydrated with water and Gatorade Advance diet as tolerated.

## 2018-01-10 NOTE — ED Triage Notes (Signed)
Pt presents with ongoing vomiting, persistent headache, generalized body aches and sore throat.

## 2018-01-21 ENCOUNTER — Other Ambulatory Visit: Payer: Self-pay

## 2018-01-21 ENCOUNTER — Encounter (HOSPITAL_COMMUNITY): Payer: Self-pay | Admitting: *Deleted

## 2018-01-21 ENCOUNTER — Ambulatory Visit (HOSPITAL_COMMUNITY)
Admission: EM | Admit: 2018-01-21 | Discharge: 2018-01-21 | Disposition: A | Discharge status: Home / Self Care | Payer: BLUE CROSS/BLUE SHIELD | Attending: Family Medicine | Admitting: Family Medicine

## 2018-01-21 DIAGNOSIS — R062 Wheezing: Secondary | ICD-10-CM

## 2018-01-21 DIAGNOSIS — J069 Acute upper respiratory infection, unspecified: Secondary | ICD-10-CM | POA: Diagnosis not present

## 2018-01-21 MED ORDER — CETIRIZINE-PSEUDOEPHEDRINE ER 5-120 MG PO TB12: 1.0000 | ORAL_TABLET | Freq: Every day | ORAL | 0 refills | Status: DC

## 2018-01-21 MED ORDER — PREDNISONE 10 MG (21) PO TBPK: ORAL_TABLET | ORAL | 0 refills | Status: DC

## 2018-01-21 MED ORDER — IPRATROPIUM-ALBUTEROL 0.5-2.5 (3) MG/3ML IN SOLN
3.0000 mL | Freq: Once | RESPIRATORY_TRACT | Status: AC
Start: 2018-01-21 — End: 2018-01-21
  Administered 2018-01-21: 3 mL via RESPIRATORY_TRACT

## 2018-01-21 MED ORDER — ALBUTEROL SULFATE HFA 108 (90 BASE) MCG/ACT IN AERS: 1.0000 | INHALATION_SPRAY | Freq: Four times a day (QID) | RESPIRATORY_TRACT | 0 refills | Status: DC | PRN

## 2018-01-21 MED ORDER — IPRATROPIUM-ALBUTEROL 0.5-2.5 (3) MG/3ML IN SOLN
RESPIRATORY_TRACT | Status: AC
  Filled 2018-01-21: qty 3

## 2018-01-21 NOTE — ED Triage Notes (Signed)
C/o nasal congestion  And states he hurts all over.

## 2018-01-21 NOTE — Discharge Instructions (Addendum)
It was nice meeting you!!  We will go ahead and treat you for upper respiratory infection.  We gave you a nebulizer treatment in clinic to help with the wheezing in your lungs I am sending you home with a inhaler to use as needed every 6 hours for cough, wheezing, shortness of breath. Prednisone for the inflammation in your lungs for 6 days.  Make sure you take this with food Zyrtec-D for allergies and congestion. Follow up as needed for continued or worsening symptoms

## 2018-01-21 NOTE — ED Provider Notes (Signed)
[facility]    CSN: [id number] Arrival date & time: 01/21/18  1510     History   Chief Complaint Chief Complaint  Patient presents with  . Nasal Congestion  . Cough    HPI Brian Mclean is a 32 y.o. male.   Patient is a 32 year old male that presents with cough, congestion, rhinorrhea, sneezing, itchy watery eyes, intermittent shortness of breath x3 days.  This initially started with a scratchy throat and then progressed.   He has been coughing up mucus. He has had some body aches and sweats.  He denies any history of asthma, allergies.  He does not take anything for his symptoms.  He denies any chest pain, palpitations. He has subjective fever.  He is a light tobacco smoker.  ROS per HPI      History reviewed. No pertinent past medical history.  There are no active problems to display for this patient.   History reviewed. No pertinent surgical history.     Home Medications    Prior to Admission medications   Medication Sig Start Date End Date Taking? Authorizing Provider  albuterol (PROVENTIL HFA;VENTOLIN HFA) 108 (90 Base) MCG/ACT inhaler Inhale 1-2 puffs into the lungs every 6 (six) hours as needed for wheezing or shortness of breath. 01/21/18   Timonthy Hovater, Gloris Manchester A, NP  cetirizine-pseudoephedrine (ZYRTEC-D) 5-120 MG tablet Take 1 tablet by mouth daily. 01/21/18   Dahlia Byes A, NP  diclofenac (VOLTAREN) 75 MG EC tablet Take 1 tablet (75 mg total) by mouth 2 (two) times daily. 01/22/17   Elvina Sidle, MD  nystatin cream (MYCOSTATIN) Apply to affected area 2 times daily 10/29/16   Cristina Gong, PA-C  ondansetron (ZOFRAN) 4 MG tablet Take 1 tablet (4 mg total) by mouth every 6 (six) hours. 01/10/18   Laithan Conchas, Gloris Manchester A, NP  predniSONE (STERAPRED UNI-PAK 21 TAB) 10 MG (21) TBPK tablet 6 tabs for 1 day, then 5 tabs for 1 das, then 4 tabs for 1 day, then 3 tabs for 1 day, 2 tabs for 1 day, then 1 tab for 1 day 01/21/18   Janace Aris, NP    Family  History Family History  Problem Relation Age of Onset  . Hypertension Other   . Stroke Other   . CAD Other   . Diabetes Other   . Asthma Other     Social History Social History   Tobacco Use  . Smoking status: Current Some Day Smoker    Types: Cigarettes    Last attempt to quit: 12/27/2016    Years since quitting: 1.0  . Smokeless tobacco: Never Used  Substance Use Topics  . Alcohol use: Yes  . Drug use: No     Allergies   Penicillins   Review of Systems Review of Systems   Physical Exam Triage Vital Signs ED Triage Vitals  Enc Vitals Group     BP 01/21/18 1536 (!) 132/92     Pulse Rate 01/21/18 1536 77     Resp 01/21/18 1536 20     Temp 01/21/18 1536 98.4 F (36.9 C)     Temp Source 01/21/18 1536 Oral     SpO2 01/21/18 1536 95 %     Weight --      Height --      Head Circumference --      Peak Flow --      Pain Score 01/21/18 1538 8     Pain Loc --  Pain Edu? --      Excl. in GC? --    No data found.  Updated Vital Signs BP (!) 132/92 (BP Location: Left Arm)   Pulse 77   Temp 98.4 F (36.9 C) (Oral)   Resp 20   SpO2 95%   Visual Acuity Right Eye Distance:   Left Eye Distance:   Bilateral Distance:    Right Eye Near:   Left Eye Near:    Bilateral Near:     Physical Exam  Constitutional: He is oriented to person, place, and time. He appears well-developed and well-nourished.  Very pleasant. Non toxic or ill appearing.     HENT:  Head: Normocephalic and atraumatic.  Bilateral TMs normal.  External ears normal.  Mild posterior oropharyngeal erythema, without tonsillar swelling or exudates. No lesions.  Moderate drainage in posterior oropharynx Clear drainage from nares.  Moderate nasal turbinate swelling No lymphadenopathy.     Eyes: Conjunctivae are normal.  Neck: Normal range of motion.  Cardiovascular: Normal rate, regular rhythm and normal heart sounds.  Pulmonary/Chest:  Decreased lung sounds with inspiratory and  expiratory wheezing in all lung fields. Mild dyspnea with deep breathing.    Musculoskeletal: Normal range of motion.  Lymphadenopathy:    He has no cervical adenopathy.  Neurological: He is alert and oriented to person, place, and time.  Skin: Skin is warm and dry.  Psychiatric: He has a normal mood and affect.  Nursing note and vitals reviewed.    UC Treatments / Results  Labs (all labs ordered are listed, but only abnormal results are displayed) Labs Reviewed - No data to display  EKG None  Radiology No results found.  Procedures Procedures (including critical care time)  Medications Ordered in UC Medications  ipratropium-albuterol (DUONEB) 0.5-2.5 (3) MG/3ML nebulizer solution 3 mL (3 mLs Nebulization Given 01/21/18 1614)    Initial Impression / Assessment and Plan / UC Course  I have reviewed the triage vital signs and the nursing notes.  Pertinent labs & imaging results that were available during my care of the patient were reviewed by me and considered in my medical decision making (see chart for details).     URI-  Lung sounds improved after DuoNeb.  Still some mild wheezing in the right upper lobe.  We will go ahead and treat with albuterol inhaler, prednisone and Zyrtec-D for allergies and congestion. Instructed to follow-up for continued or worsening symptoms Final Clinical Impressions(s) / UC Diagnoses   Final diagnoses:  Acute upper respiratory infection     Discharge Instructions     It was nice meeting you!!  We will go ahead and treat you for upper respiratory infection.  We gave you a nebulizer treatment in clinic to help with the wheezing in your lungs I am sending you home with a inhaler to use as needed every 6 hours for cough, wheezing, shortness of breath. Prednisone for the inflammation in your lungs for 6 days.  Make sure you take this with food Zyrtec-D for allergies and congestion. Follow up as needed for continued or worsening  symptoms     ED Prescriptions    Medication Sig Dispense Auth. Provider   albuterol (PROVENTIL HFA;VENTOLIN HFA) 108 (90 Base) MCG/ACT inhaler Inhale 1-2 puffs into the lungs every 6 (six) hours as needed for wheezing or shortness of breath. 1 Inhaler Lanard Arguijo A, NP   predniSONE (STERAPRED UNI-PAK 21 TAB) 10 MG (21) TBPK tablet 6 tabs for 1 day, then 5 tabs  for 1 das, then 4 tabs for 1 day, then 3 tabs for 1 day, 2 tabs for 1 day, then 1 tab for 1 day 21 tablet Jett Fukuda A, NP   cetirizine-pseudoephedrine (ZYRTEC-D) 5-120 MG tablet Take 1 tablet by mouth daily. 30 tablet Janace Aris, NP     Controlled Substance Prescriptions [place] Controlled Substance Registry consulted? no   Janace Aris, NP 01/21/18 1651

## 2019-04-29 ENCOUNTER — Other Ambulatory Visit: Payer: Self-pay | Admitting: Family Medicine

## 2019-04-29 ENCOUNTER — Ambulatory Visit: Payer: Self-pay | Attending: Internal Medicine

## 2019-04-29 DIAGNOSIS — Z20822 Contact with and (suspected) exposure to covid-19: Secondary | ICD-10-CM | POA: Insufficient documentation

## 2019-04-30 LAB — NOVEL CORONAVIRUS, NAA: SARS-CoV-2, NAA: NOT DETECTED

## 2019-05-25 ENCOUNTER — Ambulatory Visit: Payer: Self-pay | Attending: Internal Medicine

## 2019-05-25 DIAGNOSIS — Z20822 Contact with and (suspected) exposure to covid-19: Secondary | ICD-10-CM | POA: Insufficient documentation

## 2019-05-28 LAB — NOVEL CORONAVIRUS, NAA: SARS-CoV-2, NAA: NOT DETECTED

## 2019-05-29 ENCOUNTER — Telehealth: Payer: Self-pay | Admitting: General Practice

## 2019-05-29 NOTE — Telephone Encounter (Signed)
Negative COVID results given. Patient results "NOT Detected." Caller expressed understanding. ° °

## 2019-06-19 IMAGING — US US MISC SOFT TISSUE
1 series · 13 of 13 positions shown · non-contrast
Comparison: None.

CLINICAL DATA: 31 y/o M; penile area of pain and swelling. Concern
for abscess.

EXAM:
ULTRASOUND OF PENIS
TECHNIQUE: Ultrasound examination of the right-sided penis soft tissues was
performed in the area of clinical concern.

[Series 1: us misc soft tissue · 0.06mm/px · 13 acquisitions, 13 frames shown]
[im 1/13]
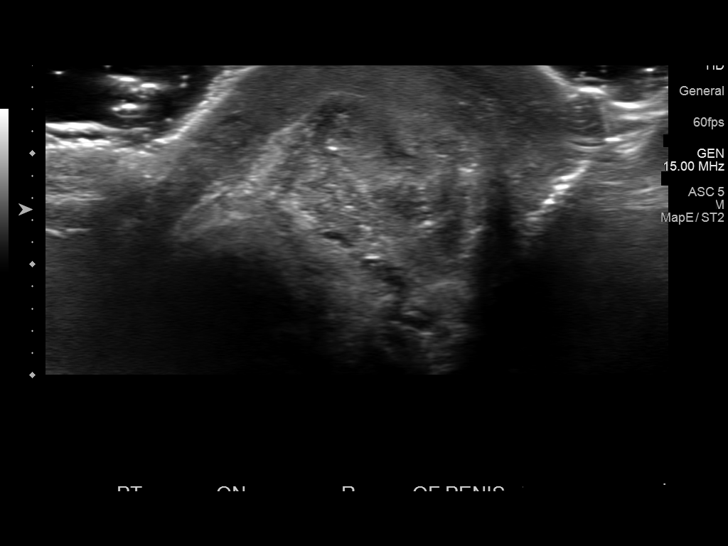
[im 2/13]
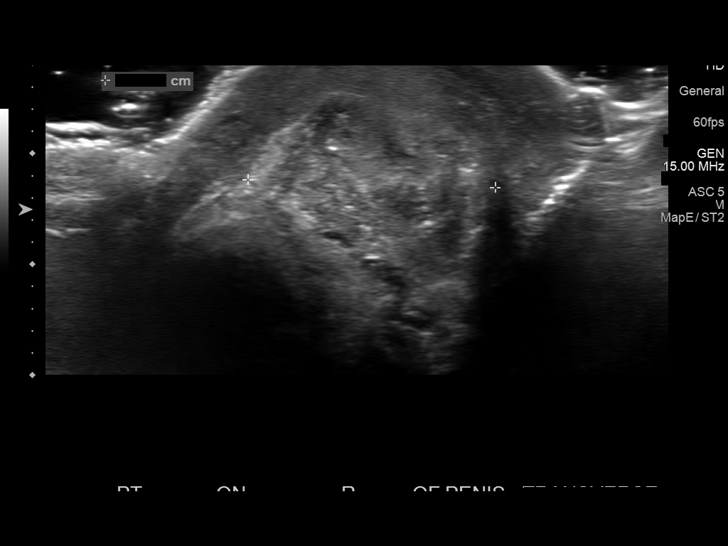
[im 3/13]
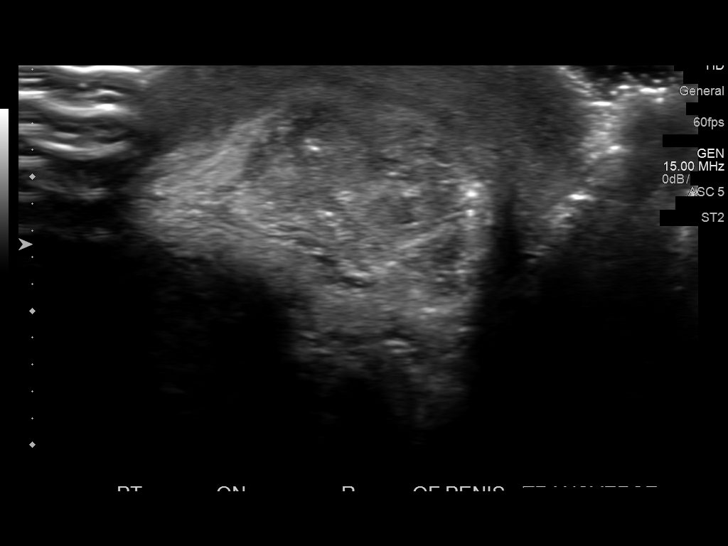
[im 4/13]
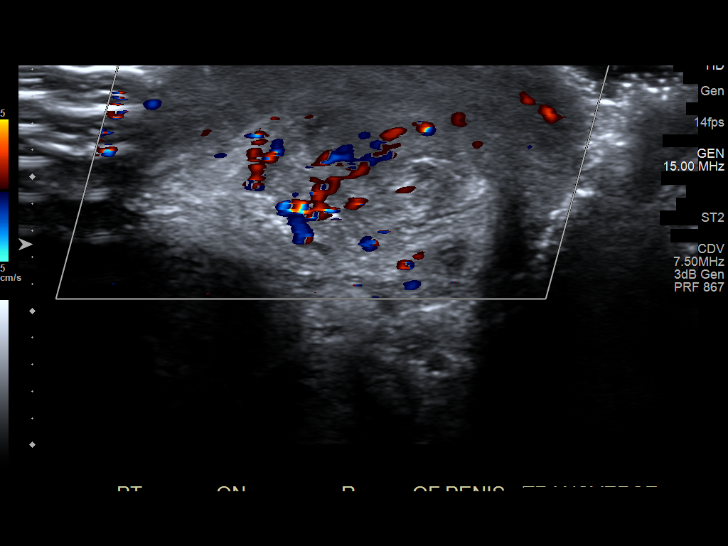
[im 5/13]
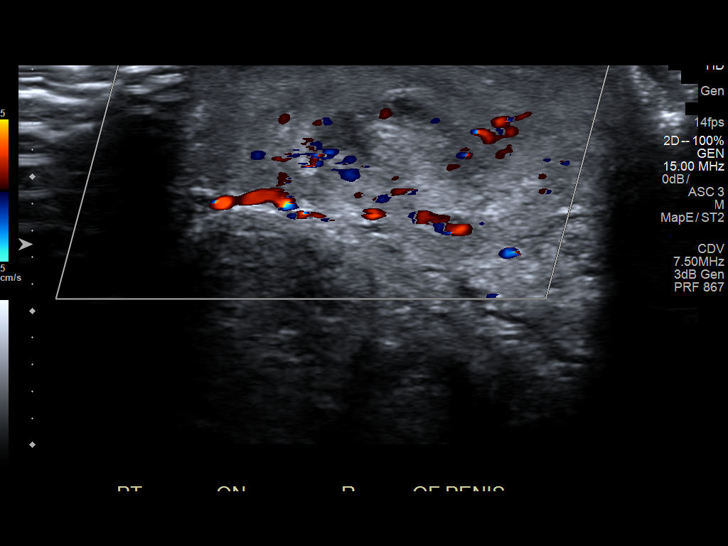
[im 6/13]
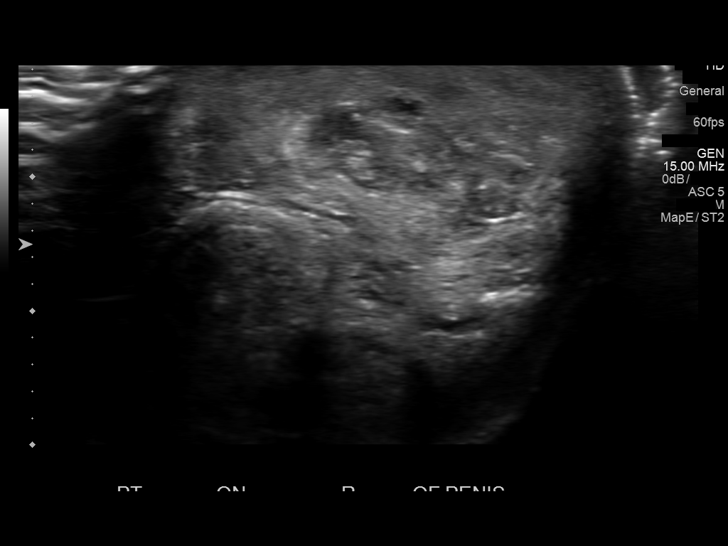
[im 7/13]
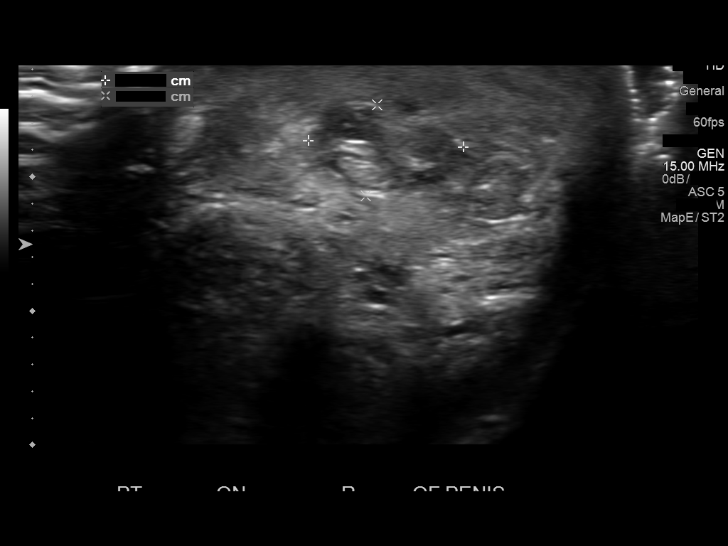
[im 8/13]
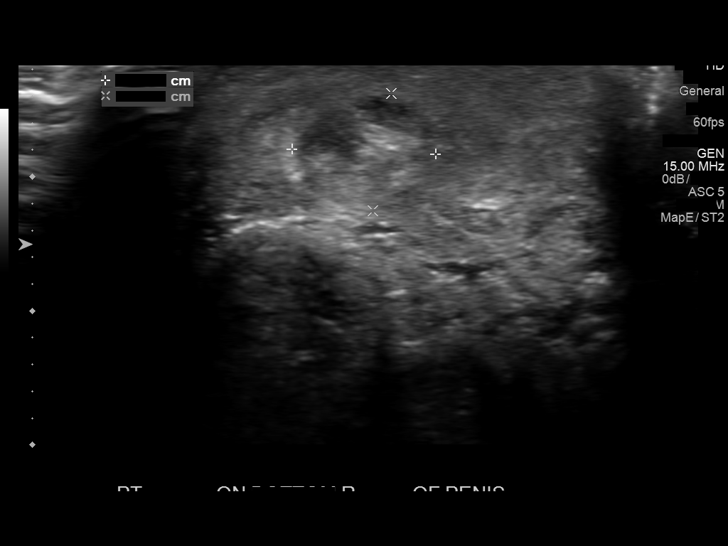
[im 9/13]
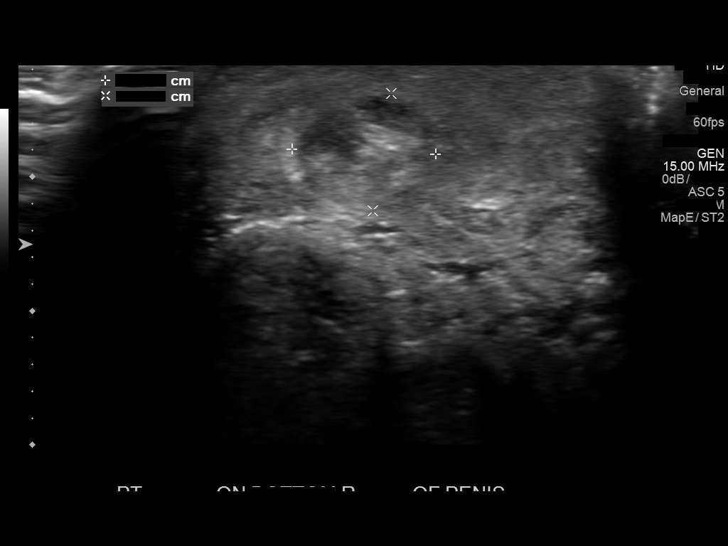
[im 10/13]
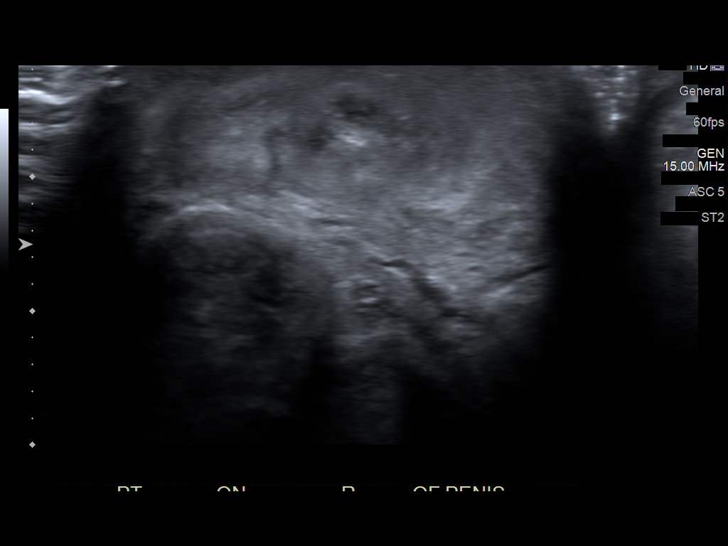
[im 11/13]
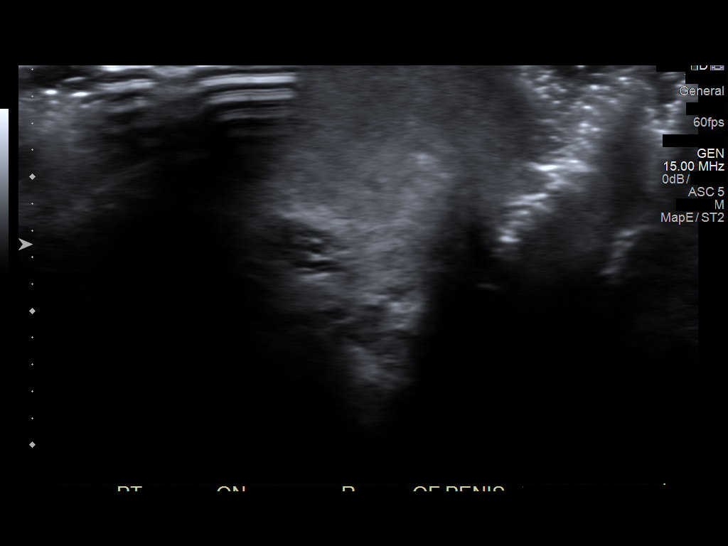
[im 12/13]
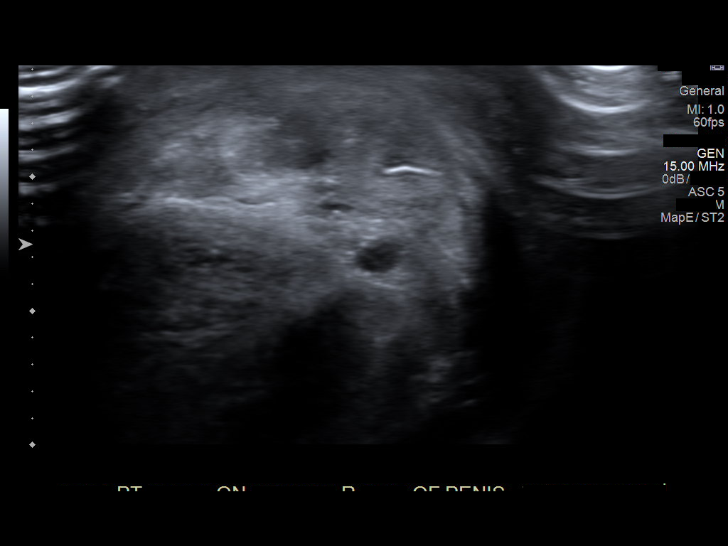
[im 13/13]
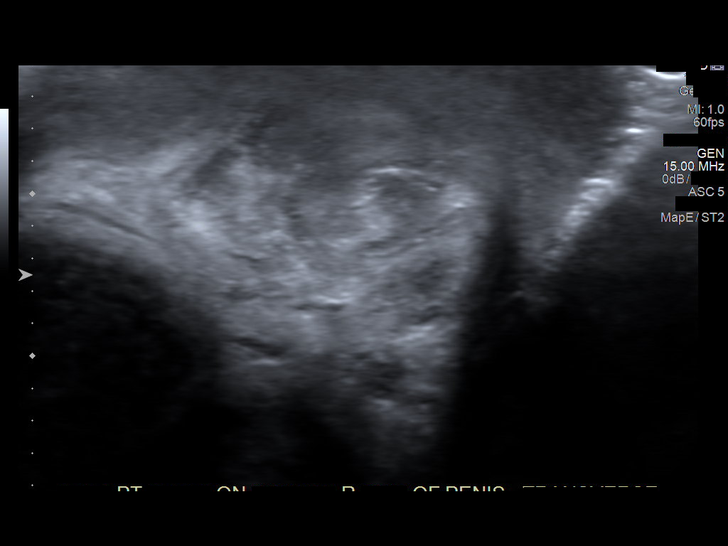

[13 of 13 positions shown; findings below may reference images not displayed]

FINDINGS: In the area of interest on the right-sided penis there is a
ill-defined focus of mixed echogenicity measuring up to 1.2 cm
centered deep to the dermis. There are foci of fluid within the
lesion. The lesion demonstrates a few foci of internal vascularity
on color Doppler. There is thickening of the overlying skin likely
representing edema.
IMPRESSION: Heterogeneous lesion in the right side of penis measuring 1.2 cm
deep to the dermis with internal vascularity. Edema in the overlying
skin. Given pain and swelling this probably represents a phlegmon,
sebaceous cyst, or granuloma. Follow-up to resolution is recommended
to exclude a neoplastic process as the lesion demonstrates internal
vascularity.

By: Maurisio Yung M.D.

## 2019-07-08 ENCOUNTER — Encounter (HOSPITAL_COMMUNITY): Payer: Self-pay

## 2019-07-08 ENCOUNTER — Ambulatory Visit (HOSPITAL_COMMUNITY)
Admission: EM | Admit: 2019-07-08 | Discharge: 2019-07-08 | Disposition: A | Discharge status: Home / Self Care | Payer: Self-pay | Attending: Urgent Care | Admitting: Urgent Care

## 2019-07-08 ENCOUNTER — Other Ambulatory Visit: Payer: Self-pay

## 2019-07-08 DIAGNOSIS — M5416 Radiculopathy, lumbar region: Secondary | ICD-10-CM

## 2019-07-08 DIAGNOSIS — M5441 Lumbago with sciatica, right side: Secondary | ICD-10-CM

## 2019-07-08 DIAGNOSIS — M62830 Muscle spasm of back: Secondary | ICD-10-CM

## 2019-07-08 MED ORDER — TIZANIDINE HCL 4 MG PO TABS: 4.0000 mg | ORAL_TABLET | Freq: Four times a day (QID) | ORAL | 0 refills | Status: DC | PRN

## 2019-07-08 MED ORDER — MELOXICAM 7.5 MG PO TABS: 7.5000 mg | ORAL_TABLET | Freq: Every day | ORAL | 0 refills | Status: DC

## 2019-07-08 MED ORDER — PREDNISONE 20 MG PO TABS: ORAL_TABLET | ORAL | 0 refills | Status: DC

## 2019-07-08 NOTE — Discharge Instructions (Signed)
Start prednisone course to address your current back pain which I suspect is being caused by the nature of your work, irritated spinal nerves. It is ok to use a muscle relaxant with this. After the steroid course is finished you can use meloxicam if you continue to have back pain.

## 2019-07-08 NOTE — ED Provider Notes (Signed)
[place]   MRN: [id number] DOB: 1986/01/11  Subjective:   Brian Mclean is a 34 y.o. male presenting for 2-day history of acute onset moderate to severe right-sided low back pain that radiates all the way down to his right posterior calf.  Denies trauma, falls.  Has a history of having difficulty with pinched nerves.  Previously been prescribed muscle relaxants, prednisone course.  He states that he has used a muscle relaxant more consistently but did not finish out his prednisone course.  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  Denies incontinence, changes in bowel or bladder function, dysuria, hematuria, history of kidney stones.  Uses albuterol inhaler.    Allergies  Allergen Reactions  . Penicillins Swelling    Has patient had a PCN reaction causing immediate rash, facial/tongue/throat swelling, SOB or lightheadedness with hypotension: Unknown Has patient had a PCN reaction causing severe rash involving mucus membranes or skin necrosis: Unknown Has patient had a PCN reaction that required hospitalization: Unknown Has patient had a PCN reaction occurring within the last 10 years: No If all of the above answers are "NO", then may proceed with Cephalosporin use.     History reviewed. No pertinent past medical history.   History reviewed. No pertinent surgical history.  Family History  Problem Relation Age of Onset  . Hypertension Other   . Stroke Other   . CAD Other   . Diabetes Other   . Asthma Other     Social History   Tobacco Use  . Smoking status: Current Some Day Smoker    Types: Cigarettes    Last attempt to quit: 12/27/2016    Years since quitting: 2.5  . Smokeless tobacco: Never Used  Substance Use Topics  . Alcohol use: Never  . Drug use: No    ROS   Objective:   Vitals: BP (!) 144/87   Pulse 89   Temp 98.3 F (36.8 C) (Oral)   Resp 18   Ht 5\' 11"  (1.803 m)   Wt (!) 304 lb (137.9 kg)   SpO2 94%    BMI 42.40 kg/m   Physical Exam Constitutional:      General: He is not in acute distress.    Appearance: Normal appearance. He is well-developed. He is obese. He is not ill-appearing, toxic-appearing or diaphoretic.  HENT:     Head: Normocephalic and atraumatic.     Right Ear: External ear normal.     Left Ear: External ear normal.     Nose: Nose normal.     Mouth/Throat:     Pharynx: Oropharynx is clear.  Eyes:     General: No scleral icterus.       Right eye: No discharge.        Left eye: No discharge.     Extraocular Movements: Extraocular movements intact.     Pupils: Pupils are equal, round, and reactive to light.  Cardiovascular:     Rate and Rhythm: Normal rate.  Pulmonary:     Effort: Pulmonary effort is normal.  Musculoskeletal:     Cervical back: Normal range of motion.     Lumbar back: Tenderness present. No swelling, deformity, lacerations, spasms or bony tenderness. Decreased range of motion (moving gingerly as well favoring low back). Positive right straight leg raise test. Negative left straight leg raise test. No scoliosis.  Skin:    General: Skin is warm and dry.  Neurological:     Mental Status:  He is alert and oriented to person, place, and time.     Deep Tendon Reflexes: Reflexes normal.  Psychiatric:        Mood and Affect: Mood normal.        Behavior: Behavior normal.        Thought Content: Thought content normal.        Judgment: Judgment normal.      Assessment and Plan :   1. Lumbar radiculopathy   2. Acute right-sided low back pain with right-sided sciatica   3. Muscle spasm of back     Start short prednisone course, muscle relaxant as well for lumbar radiculopathy and spasms of back.  Use meloxicam once he is finished with prednisone course.  Recommended patient consider changing line of work weight loss as a help to his recurrent back pain. Deferred x-ray today as an MRI would be more helpful if his sx persist. Counseled patient on  potential for adverse effects with medications prescribed/recommended today, ER and return-to-clinic precautions discussed, patient verbalized understanding.    Wallis Bamberg, PA-C 07/08/19 1348

## 2019-07-08 NOTE — ED Triage Notes (Signed)
Pt c/o right lower back pain that shoots down his right leg that started 2 days ago. Pt states he was treated for a pinched nerve in his back in the past w/medicine and he states he has been doing his back exercises he was told to do, but it's not helping. Pt states he has numbness and tingling in right leg. Pt was able to walk to exam room. Pt denies loss of bowel or bladder.

## 2019-08-03 ENCOUNTER — Ambulatory Visit: Payer: Self-pay | Attending: Internal Medicine

## 2019-08-03 DIAGNOSIS — Z20822 Contact with and (suspected) exposure to covid-19: Secondary | ICD-10-CM

## 2019-08-03 DIAGNOSIS — U071 COVID-19: Secondary | ICD-10-CM | POA: Insufficient documentation

## 2019-08-05 LAB — NOVEL CORONAVIRUS, NAA: SARS-CoV-2, NAA: DETECTED — AB

## 2019-08-05 LAB — SARS-COV-2, NAA 2 DAY TAT

## 2019-08-06 ENCOUNTER — Telehealth: Payer: Self-pay | Admitting: Internal Medicine

## 2019-08-06 NOTE — Telephone Encounter (Signed)
Called to discuss with [name] about Covid symptoms and the use of bamlanivimab-efesavimab, a monoclonal antibody infusion for those with mild to moderate Covid symptoms and at a high risk of hospitalization.  Pt has BMI >40.   Pt does not qualify for infusion therapy as his symptoms first presented > 10 days prior to timing of infusion. Symptoms tier reviewed as well as criteria for ending isolation. Preventative practices reviewed. Patient verbalized understanding   There are no problems to display for this patient.  Cyndee Brightly, NP-C [facility]

## 2020-02-15 ENCOUNTER — Ambulatory Visit (HOSPITAL_COMMUNITY)
Admission: EM | Admit: 2020-02-15 | Discharge: 2020-02-15 | Disposition: A | Discharge status: Home / Self Care | Payer: Self-pay | Attending: Internal Medicine | Admitting: Internal Medicine

## 2020-02-15 ENCOUNTER — Encounter (HOSPITAL_COMMUNITY): Payer: Self-pay | Admitting: *Deleted

## 2020-02-15 ENCOUNTER — Other Ambulatory Visit: Payer: Self-pay

## 2020-02-15 DIAGNOSIS — M5431 Sciatica, right side: Secondary | ICD-10-CM

## 2020-02-15 MED ORDER — TRAMADOL HCL 50 MG PO TABS: 50.0000 mg | ORAL_TABLET | Freq: Two times a day (BID) | ORAL | 0 refills | Status: DC | PRN

## 2020-02-15 MED ORDER — IBUPROFEN 600 MG PO TABS: 600.0000 mg | ORAL_TABLET | Freq: Four times a day (QID) | ORAL | 0 refills | Status: DC | PRN

## 2020-02-15 MED ORDER — KETOROLAC TROMETHAMINE 60 MG/2ML IM SOLN
60.0000 mg | Freq: Once | INTRAMUSCULAR | Status: AC
  Administered 2020-02-15: 60 mg via INTRAMUSCULAR

## 2020-02-15 MED ORDER — CYCLOBENZAPRINE HCL 10 MG PO TABS: 10.0000 mg | ORAL_TABLET | Freq: Three times a day (TID) | ORAL | 0 refills | Status: DC | PRN

## 2020-02-15 MED ORDER — KETOROLAC TROMETHAMINE 60 MG/2ML IM SOLN
INTRAMUSCULAR | Status: AC
  Filled 2020-02-15: qty 2

## 2020-02-15 NOTE — ED Provider Notes (Signed)
[facility]    CSN: [id number] Arrival date & time: 02/15/20  1128      History   Chief Complaint Chief Complaint  Patient presents with  . Back Pain    HPI Brian Mclean is a 33 y.o. male comes to urgent care with a 1 day history of lower back pain.  Pain is severe, fairly sudden onset, radiates down the right leg and associated with some numbness in the right leg.  Pain is aggravated by movement.  It was not relieved with over-the-counter ibuprofen.  No weakness in the right leg.  Patient denies any fall or trauma.  He has had sciatica in the past but this episode seems to be worse than what he would usually experience.  Patient complains of muscle spasms.   HPI  History reviewed. No pertinent past medical history.  There are no problems to display for this patient.   History reviewed. No pertinent surgical history.     Home Medications    Prior to Admission medications   Medication Sig Start Date End Date Taking? Authorizing Provider  cyclobenzaprine (FLEXERIL) 10 MG tablet Take 1 tablet (10 mg total) by mouth 3 (three) times daily as needed for muscle spasms. 02/15/20   Merrilee Jansky, MD  ibuprofen (ADVIL) 600 MG tablet Take 1 tablet (600 mg total) by mouth every 6 (six) hours as needed. 02/15/20   Genevieve Arbaugh, Britta Mccreedy, MD  traMADol (ULTRAM) 50 MG tablet Take 1 tablet (50 mg total) by mouth every 12 (twelve) hours as needed for moderate pain or severe pain. 02/15/20   Amiria Orrison, Britta Mccreedy, MD  albuterol (PROVENTIL HFA;VENTOLIN HFA) 108 (90 Base) MCG/ACT inhaler Inhale 1-2 puffs into the lungs every 6 (six) hours as needed for wheezing or shortness of breath. 01/21/18 02/15/20  Janace Aris, NP    Family History Family History  Problem Relation Age of Onset  . Hypertension Other   . Stroke Other   . CAD Other   . Diabetes Other   . Asthma Other     Social History Social History   Tobacco Use  . Smoking status: Current Some Day Smoker    Types:  Cigarettes    Last attempt to quit: 12/27/2016    Years since quitting: 3.1  . Smokeless tobacco: Never Used  Vaping Use  . Vaping Use: Every day  Substance Use Topics  . Alcohol use: Never  . Drug use: No     Allergies   Penicillins   Review of Systems Review of Systems  Constitutional: Negative.   HENT: Negative.   Musculoskeletal: Positive for back pain. Negative for neck pain and neck stiffness.  Skin: Negative.  Negative for rash and wound.  Neurological: Negative.      Physical Exam Triage Vital Signs ED Triage Vitals  Enc Vitals Group     BP 02/15/20 1348 (!) 150/103     Pulse Rate 02/15/20 1348 76     Resp 02/15/20 1348 20     Temp 02/15/20 1348 98.2 F (36.8 C)     Temp Source 02/15/20 1348 Oral     SpO2 02/15/20 1348 (!) 9 %     Weight 02/15/20 1345 (!) 305 lb (138.3 kg)     Height 02/15/20 1345 5\' 11"  (1.803 m)     Head Circumference --      Peak Flow --      Pain Score 02/15/20 1344 10     Pain Loc --  Pain Edu? --      Excl. in GC? --    No data found.  Updated Vital Signs BP (!) 150/103 (BP Location: Right Arm)   Pulse 76   Temp 98.2 F (36.8 C) (Oral)   Resp 20   Ht 5\' 11"  (1.803 m)   Wt (!) 138.3 kg   SpO2 (!) 9%   BMI 42.54 kg/m   Visual Acuity Right Eye Distance:   Left Eye Distance:   Bilateral Distance:    Right Eye Near:   Left Eye Near:    Bilateral Near:     Physical Exam Vitals and nursing note reviewed.  Cardiovascular:     Pulses: Normal pulses.     Heart sounds: Normal heart sounds.  Abdominal:     General: Bowel sounds are normal.     Palpations: Abdomen is soft.  Musculoskeletal:        General: No swelling or signs of injury. Normal range of motion.     Right lower leg: No edema.  Skin:    General: Skin is warm.  Neurological:     General: No focal deficit present.      UC Treatments / Results  Labs (all labs ordered are listed, but only abnormal results are displayed) Labs Reviewed - No data to  display  EKG   Radiology No results found.  Procedures Procedures (including critical care time)  Medications Ordered in UC Medications  ketorolac (TORADOL) injection 60 mg (has no administration in time range)    Initial Impression / Assessment and Plan / UC Course  I have reviewed the triage vital signs and the nursing notes.  Pertinent labs & imaging results that were available during my care of the patient were reviewed by me and considered in my medical decision making (see chart for details).    1.  Sciatica of right side: Toradol 60 mg IM x1 dose Ibuprofen 600 mg every 6 hours as needed for pain Tramadol 50 mg every 12 hours as needed for pain Flexeril 10 mg every 8 hours as needed for muscle spasms. Gentle range of motion exercises Heat therapy Return precautions given No indication for radiologic evaluation. Final Clinical Impressions(s) / UC Diagnoses   Final diagnoses:  Sciatica of right side     Discharge Instructions     Heating pad as needed Gentle range of motion exercises    ED Prescriptions    Medication Sig Dispense Auth. Provider   ibuprofen (ADVIL) 600 MG tablet Take 1 tablet (600 mg total) by mouth every 6 (six) hours as needed. 30 tablet Raife Lizer, , MD   traMADol (ULTRAM) 50 MG tablet Take 1 tablet (50 mg total) by mouth every 12 (twelve) hours as needed for moderate pain or severe pain. 10 tablet Odell Choung, Britta Mccreedy, MD   cyclobenzaprine (FLEXERIL) 10 MG tablet Take 1 tablet (10 mg total) by mouth 3 (three) times daily as needed for muscle spasms. 20 tablet Bentley Haralson, Britta Mccreedy, MD     I have reviewed the PDMP during this encounter.   Britta Mccreedy, MD 02/15/20 [id number]

## 2020-02-15 NOTE — Discharge Instructions (Signed)
Heating pad as needed Gentle range of motion exercises

## 2020-02-15 NOTE — ED Triage Notes (Signed)
Pt reports back pain started Sunday with out injury. Pt reports bad nerves in his back. the patient reported it took him 20 mins to get out of bed due to pain. Pt reports  back pain that radiated down RT leg.

## 2020-09-21 ENCOUNTER — Ambulatory Visit (HOSPITAL_COMMUNITY)
Admission: EM | Admit: 2020-09-21 | Discharge: 2020-09-21 | Disposition: A | Discharge status: Home / Self Care | Payer: Self-pay | Attending: Emergency Medicine | Admitting: Emergency Medicine

## 2020-09-21 ENCOUNTER — Other Ambulatory Visit: Payer: Self-pay

## 2020-09-21 ENCOUNTER — Encounter (HOSPITAL_COMMUNITY): Payer: Self-pay

## 2020-09-21 DIAGNOSIS — M79661 Pain in right lower leg: Secondary | ICD-10-CM | POA: Insufficient documentation

## 2020-09-21 LAB — BASIC METABOLIC PANEL
Anion gap: 7 (ref 5–15)
BUN: 12 mg/dL (ref 6–20)
CO2: 29 mmol/L (ref 22–32)
Calcium: 9.4 mg/dL (ref 8.9–10.3)
Chloride: 102 mmol/L (ref 98–111)
Creatinine, Ser: 1.13 mg/dL (ref 0.61–1.24)
GFR, Estimated: >60 mL/min (ref >60)
Glucose, Bld: 105 mg/dL — ABNORMAL HIGH (ref 70–99)
Potassium: 4.2 mmol/L (ref 3.5–5.1)
Sodium: 138 mmol/L (ref 135–145)

## 2020-09-21 MED ORDER — IBUPROFEN 800 MG PO TABS: 800.0000 mg | ORAL_TABLET | Freq: Three times a day (TID) | ORAL | 0 refills | Status: DC

## 2020-09-21 MED ORDER — CYCLOBENZAPRINE HCL 10 MG PO TABS: 10.0000 mg | ORAL_TABLET | Freq: Three times a day (TID) | ORAL | 0 refills | Status: DC | PRN

## 2020-09-21 NOTE — ED Triage Notes (Signed)
Pt in with c/o right leg pain that started last week  Denies any recent injury to leg   states the pain is sharp and otc medication is not helping

## 2020-09-21 NOTE — ED Provider Notes (Signed)
[facility]    CSN: [id number] Arrival date & time: 09/21/20  5027      History   Chief Complaint Chief Complaint  Patient presents with  . Leg Pain    HPI Brian Mclean is a 35 y.o. male.   Patient presents with right calf pain described as cramping beginning 10 days ago.  Intermittently worsened when in bed position, pain relieved when the leg straightened.  Denies numbness, tingling or swelling.  Range of motion intact.  Denies shortness of breath, chest pain, abdominal pain, difficulty breathing.  No cardiac history.  Denies recent travel. History reviewed. No pertinent past medical history.  There are no problems to display for this patient.   History reviewed. No pertinent surgical history.     Home Medications    Prior to Admission medications   Medication Sig Start Date End Date Taking? Authorizing Provider  cyclobenzaprine (FLEXERIL) 10 MG tablet Take 1 tablet (10 mg total) by mouth 3 (three) times daily as needed for muscle spasms. 09/21/20   Egor Fullilove, Elita Boone, NP  ibuprofen (ADVIL) 800 MG tablet Take 1 tablet (800 mg total) by mouth 3 (three) times daily. 09/21/20   Charlott Calvario, Elita Boone, NP  traMADol (ULTRAM) 50 MG tablet Take 1 tablet (50 mg total) by mouth every 12 (twelve) hours as needed for moderate pain or severe pain. 02/15/20   Lamptey, Britta Mccreedy, MD  albuterol (PROVENTIL HFA;VENTOLIN HFA) 108 (90 Base) MCG/ACT inhaler Inhale 1-2 puffs into the lungs every 6 (six) hours as needed for wheezing or shortness of breath. 01/21/18 02/15/20  Janace Aris, NP    Family History Family History  Problem Relation Age of Onset  . Hypertension Other   . Stroke Other   . CAD Other   . Diabetes Other   . Asthma Other     Social History Social History   Tobacco Use  . Smoking status: Current Some Day Smoker    Types: Cigarettes    Last attempt to quit: 12/27/2016    Years since quitting: 3.7  . Smokeless tobacco: Never Used  Vaping Use  . Vaping Use:  Every day  Substance Use Topics  . Alcohol use: Never  . Drug use: No     Allergies   Penicillins   Review of Systems Review of Systems  Constitutional: Negative.   Respiratory: Negative.   Cardiovascular: Negative.   Skin: Negative.   Neurological: Negative.      Physical Exam Triage Vital Signs ED Triage Vitals  Enc Vitals Group     BP 09/21/20 1003 (!) 148/102     Pulse Rate 09/21/20 1003 79     Resp 09/21/20 1003 20     Temp 09/21/20 1003 98.8 F (37.1 C)     Temp Source 09/21/20 1003 Oral     SpO2 09/21/20 1003 94 %     Weight --      Height --      Head Circumference --      Peak Flow --      Pain Score 09/21/20 1002 9     Pain Loc --      Pain Edu? --      Excl. in GC? --    No data found.  Updated Vital Signs BP (!) 148/102 (BP Location: Right Arm)   Pulse 79   Temp 98.8 F (37.1 C) (Oral)   Resp 20   SpO2 94%   Visual Acuity Right Eye Distance:  Left Eye Distance:   Bilateral Distance:    Right Eye Near:   Left Eye Near:    Bilateral Near:     Physical Exam Constitutional:      Appearance: Normal appearance. He is obese.  HENT:     Head: Normocephalic.  Eyes:     Extraocular Movements: Extraocular movements intact.  Cardiovascular:     Pulses: Normal pulses.     Heart sounds: Normal heart sounds.  Pulmonary:     Effort: Pulmonary effort is normal.     Breath sounds: Normal breath sounds.  Musculoskeletal:     Right lower leg: Tenderness present.     Left lower leg: Normal.     Comments: Tenderness of posterior and lateral calf muscle, no swelling or edema noted  Skin:    General: Skin is warm and dry.  Neurological:     Mental Status: He is alert and oriented to person, place, and time. Mental status is at baseline.  Psychiatric:        Mood and Affect: Mood normal.        Behavior: Behavior normal.        Thought Content: Thought content normal.        Judgment: Judgment normal.      UC Treatments / Results   Labs (all labs ordered are listed, but only abnormal results are displayed) Labs Reviewed  BASIC METABOLIC PANEL    EKG   Radiology No results found.  Procedures Procedures (including critical care time)  Medications Ordered in UC Medications - No data to display  Initial Impression / Assessment and Plan / UC Course  I have reviewed the triage vital signs and the nursing notes.  Pertinent labs & imaging results that were available during my care of the patient were reviewed by me and considered in my medical decision making (see chart for details).  Right calf pain  1. Ibuprofen 800mg  tid  2. Flexeril 10 mg tid prn 3. BMP- pending, will notify for concerning results  Final Clinical Impressions(s) / UC Diagnoses   Final diagnoses:  Right calf pain     Discharge Instructions     Lab pending 24 hours, you will be notified for any concerning values  Can take ibuprofen 800 mg three times a day will snack, recommended use consisently 3-5 days then as needed  Can take muscle relaxer three times a day as needed, be mindful this may make you drowsy    ED Prescriptions    Medication Sig Dispense Auth. Provider   cyclobenzaprine (FLEXERIL) 10 MG tablet Take 1 tablet (10 mg total) by mouth 3 (three) times daily as needed for muscle spasms. 30 tablet Denver Bentson R, NP   ibuprofen (ADVIL) 800 MG tablet Take 1 tablet (800 mg total) by mouth 3 (three) times daily. 30 tablet , NP     PDMP not reviewed this encounter.   Valinda Hoar, NP 09/21/20 1124

## 2020-09-21 NOTE — Discharge Instructions (Signed)
Lab pending 24 hours, you will be notified for any concerning values  Can take ibuprofen 800 mg three times a day will snack, recommended use consisently 3-5 days then as needed  Can take muscle relaxer three times a day as needed, be mindful this may make you drowsy

## 2021-02-14 ENCOUNTER — Other Ambulatory Visit: Payer: Self-pay

## 2021-02-14 ENCOUNTER — Ambulatory Visit
Admission: EM | Admit: 2021-02-14 | Discharge: 2021-02-14 | Disposition: A | Discharge status: Home / Self Care | Payer: Self-pay | Attending: Physician Assistant | Admitting: Physician Assistant

## 2021-02-14 DIAGNOSIS — Z20822 Contact with and (suspected) exposure to covid-19: Secondary | ICD-10-CM | POA: Insufficient documentation

## 2021-02-14 DIAGNOSIS — Z8616 Personal history of COVID-19: Secondary | ICD-10-CM | POA: Insufficient documentation

## 2021-02-14 DIAGNOSIS — Z880 Allergy status to penicillin: Secondary | ICD-10-CM | POA: Insufficient documentation

## 2021-02-14 DIAGNOSIS — R0981 Nasal congestion: Secondary | ICD-10-CM

## 2021-02-14 DIAGNOSIS — B349 Viral infection, unspecified: Secondary | ICD-10-CM

## 2021-02-14 DIAGNOSIS — R051 Acute cough: Secondary | ICD-10-CM

## 2021-02-14 DIAGNOSIS — R519 Headache, unspecified: Secondary | ICD-10-CM | POA: Insufficient documentation

## 2021-02-14 DIAGNOSIS — Z7982 Long term (current) use of aspirin: Secondary | ICD-10-CM | POA: Insufficient documentation

## 2021-02-14 NOTE — Discharge Instructions (Signed)

## 2021-02-14 NOTE — ED Provider Notes (Signed)
[facility]    CSN: [id number] Arrival date & time: 02/14/21  1601      History   Chief Complaint Chief Complaint  Patient presents with   Sore Throat   Cough    HPI Brian Mclean is a 35 y.o. male presenting for 3-day history of nasal congestion, headache and head pressure, cough and scratchy throat.  Patient also mitts to body aches.  He denies fever, fatigue, chest pain, breathing occultly, vomiting or diarrhea.  Patient says 3 of his family members are ill with similar symptoms but they have not been seen.  Patient has been taking multiple over-the-counter medications including Alka-Seltzer, Mucinex, NyQuil and has been drinking orange juice.  Patient says he feels like he has a bad cold.  He reports having COVID-19 twice.  He says last time he had COVID-19 was last year.  He is otherwise healthy.  He has no other complaints.  HPI  History reviewed. No pertinent past medical history.  There are no problems to display for this patient.   History reviewed. No pertinent surgical history.     Home Medications    Prior to Admission medications   Medication Sig Start Date End Date Taking? Authorizing Provider  BAYER ASPIRIN PO Take by mouth.   Yes [provider]  cyclobenzaprine (FLEXERIL) 10 MG tablet Take 1 tablet (10 mg total) by mouth 3 (three) times daily as needed for muscle spasms. 09/21/20   White, Elita Boone, NP  ibuprofen (ADVIL) 800 MG tablet Take 1 tablet (800 mg total) by mouth 3 (three) times daily. 09/21/20   White, Elita Boone, NP  traMADol (ULTRAM) 50 MG tablet Take 1 tablet (50 mg total) by mouth every 12 (twelve) hours as needed for moderate pain or severe pain. 02/15/20   Lamptey, Britta Mccreedy, MD  albuterol (PROVENTIL HFA;VENTOLIN HFA) 108 (90 Base) MCG/ACT inhaler Inhale 1-2 puffs into the lungs every 6 (six) hours as needed for wheezing or shortness of breath. 01/21/18 02/15/20  Janace Aris, NP    Family History Family History   Problem Relation Age of Onset   Hypertension Other    Stroke Other    CAD Other    Diabetes Other    Asthma Other     Social History Social History   Tobacco Use   Smoking status: Some Days    Types: Cigarettes    Last attempt to quit: 12/27/2016    Years since quitting: 4.1   Smokeless tobacco: Never  Vaping Use   Vaping Use: Some days  Substance Use Topics   Alcohol use: Never   Drug use: No     Allergies   Penicillins   Review of Systems Review of Systems  Constitutional:  Negative for fatigue and fever.  HENT:  Positive for congestion, rhinorrhea and sore throat. Negative for sinus pressure and sinus pain.   Respiratory:  Positive for cough. Negative for shortness of breath.   Gastrointestinal:  Negative for abdominal pain, diarrhea, nausea and vomiting.  Musculoskeletal:  Positive for myalgias.  Neurological:  Positive for headaches. Negative for weakness and light-headedness.  Hematological:  Negative for adenopathy.    Physical Exam Triage Vital Signs ED Triage Vitals  Enc Vitals Group     BP 02/14/21 1648 (!) 150/90     Pulse Rate 02/14/21 1648 90     Resp 02/14/21 1648 18     Temp 02/14/21 1648 98.4 F (36.9 C)     Temp Source 02/14/21 1648  Oral     SpO2 02/14/21 1648 97 %     Weight 02/14/21 1647 (!) 340 lb (154.2 kg)     Height 02/14/21 1647 5\' 11"  (1.803 m)     Head Circumference --      Peak Flow --      Pain Score 02/14/21 1646 8     Pain Loc --      Pain Edu? --      Excl. in GC? --    No data found.  Updated Vital Signs BP (!) 150/90 (BP Location: Left Arm)   Pulse 90   Temp 98.4 F (36.9 C) (Oral)   Resp 18   Ht 5\' 11"  (1.803 m)   Wt (!) 340 lb (154.2 kg)   SpO2 97%   BMI 47.42 kg/m      Physical Exam Vitals and nursing note reviewed.  Constitutional:      General: He is not in acute distress.    Appearance: Normal appearance. He is well-developed. He is not ill-appearing or diaphoretic.  HENT:     Head: Normocephalic  and atraumatic.     Left Ear: There is no impacted cerumen.     Nose: Congestion present.     Mouth/Throat:     Mouth: Mucous membranes are moist.     Pharynx: Oropharynx is clear. Uvula midline. Posterior oropharyngeal erythema (moderate amount of clear PND) present.     Tonsils: No tonsillar abscesses.  Eyes:     General: No scleral icterus.       Right eye: No discharge.        Left eye: No discharge.     Conjunctiva/sclera: Conjunctivae normal.  Neck:     Thyroid: No thyromegaly.     Trachea: No tracheal deviation.  Cardiovascular:     Rate and Rhythm: Normal rate and regular rhythm.     Heart sounds: Normal heart sounds.  Pulmonary:     Effort: Pulmonary effort is normal. No respiratory distress.     Breath sounds: Normal breath sounds. No wheezing, rhonchi or rales.  Musculoskeletal:     Cervical back: Normal range of motion and neck supple.  Lymphadenopathy:     Cervical: No cervical adenopathy.  Skin:    General: Skin is warm and dry.     Findings: No rash.  Neurological:     General: No focal deficit present.     Mental Status: He is alert. Mental status is at baseline.     Motor: No weakness.     Gait: Gait normal.  Psychiatric:        Mood and Affect: Mood normal.        Behavior: Behavior normal.        Thought Content: Thought content normal.     UC Treatments / Results  Labs (all labs ordered are listed, but only abnormal results are displayed) Labs Reviewed  SARS CORONAVIRUS 2 (TAT 6-24 HRS)    EKG   Radiology No results found.  Procedures Procedures (including critical care time)  Medications Ordered in UC Medications - No data to display  Initial Impression / Assessment and Plan / UC Course  I have reviewed the triage vital signs and the nursing notes.  Pertinent labs & imaging results that were available during my care of the patient were reviewed by me and considered in my medical decision making (see chart for details).  35 year old  male presenting for 3-day history of cough, congestion, scratchy throat, headache and body  aches.  Patient is afebrile and overall well-appearing.  He has nasal congestion on exam and mild posterior pharyngeal erythema with clear postnasal drainage.  Chest clear to auscultation heart regular rate and rhythm.  PCR COVID test obtained.  Current CDC guidelines isolation protocol and ED precautions reviewed with patient.  Patient would be candidate for antiviral medication if he is positive for COVID.  Advised patient symptoms consistent with viral illness.  Supportive care encouraged with continuing the over-the-counter medications he has been taking but I advised him to add Flonase and consider nasal saline.  I did provide him with a work note.  Follow-up here as needed for any new or worsening symptoms or if not feeling better after 10 days.  Final Clinical Impressions(s) / UC Diagnoses   Final diagnoses:  Viral illness  Acute cough  Nasal congestion     Discharge Instructions      URI/COLD SYMPTOMS: Your exam today is consistent with a viral illness. Antibiotics are not indicated at this time. Use medications as directed, including cough syrup, nasal saline, and decongestants. Your symptoms should improve over the next few days and resolve within 7-10 days. Increase rest and fluids. F/u if symptoms worsen or predominate such as sore throat, ear pain, productive cough, shortness of breath, or if you develop high fevers or worsening fatigue over the next several days.    You have received COVID testing today either for positive exposure, concerning symptoms that could be related to COVID infection, screening purposes, or re-testing after confirmed positive.  Your test obtained today checks for active viral infection in the last 1-2 weeks. If your test is negative now, you can still test positive later. So, if you do develop symptoms you should either get re-tested and/or isolate x 5 days and  then strict mask use x 5 days (unvaccinated) or mask use x 10 days (vaccinated). Please follow CDC guidelines.  While Rapid antigen tests come back in 15-20 minutes, send out PCR/molecular test results typically come back within 1-3 days. In the mean time, if you are symptomatic, assume this could be a positive test and treat/monitor yourself as if you do have COVID.   We will call with test results if positive. Please download the MyChart app and set up a profile to access test results.   If symptomatic, go home and rest. Push fluids. Take Tylenol as needed for discomfort. Gargle warm salt water. Throat lozenges. Take Mucinex DM or Robitussin for cough. Humidifier in bedroom to ease coughing. Warm showers. Also review the COVID handout for more information.  COVID-19 INFECTION: The incubation period of COVID-19 is approximately 14 days after exposure, with most symptoms developing in roughly 4-5 days. Symptoms may range in severity from mild to critically severe. Roughly 80% of those infected will have mild symptoms. People of any age may become infected with COVID-19 and have the ability to transmit the virus. The most common symptoms include: fever, fatigue, cough, body aches, headaches, sore throat, nasal congestion, shortness of breath, nausea, vomiting, diarrhea, changes in smell and/or taste.    COURSE OF ILLNESS Some patients may begin with mild disease which can progress quickly into critical symptoms. If your symptoms are worsening please call ahead to the Emergency Department and proceed there for further treatment. Recovery time appears to be roughly 1-2 weeks for mild symptoms and 3-6 weeks for severe disease.   GO IMMEDIATELY TO ER FOR FEVER YOU ARE UNABLE TO GET DOWN WITH TYLENOL, BREATHING PROBLEMS, CHEST PAIN,  FATIGUE, LETHARGY, INABILITY TO EAT OR DRINK, ETC  QUARANTINE AND ISOLATION: To help decrease the spread of COVID-19 please remain isolated if you have COVID infection or are  highly suspected to have COVID infection. This means -stay home and isolate to one room in the home if you live with others. Do not share a bed or bathroom with others while ill, sanitize and wipe down all countertops and keep common areas clean and disinfected. Stay home for 5 days. If you have no symptoms or your symptoms are resolving after 5 days, you can leave your house. Continue to wear a mask around others for 5 additional days. If you have been in close contact (within 6 feet) of someone diagnosed with COVID 19, you are advised to quarantine in your home for 14 days as symptoms can develop anywhere from 2-14 days after exposure to the virus. If you develop symptoms, you  must isolate.  Most current guidelines for COVID after exposure -unvaccinated: isolate 5 days and strict mask use x 5 days. Test on day 5 is possible -vaccinated: wear mask x 10 days if symptoms do not develop -You do not necessarily need to be tested for COVID if you have + exposure and  develop symptoms. Just isolate at home x10 days from symptom onset During this global pandemic, CDC advises to practice social distancing, try to stay at least 42ft away from others at all times. Wear a face covering. Wash and sanitize your hands regularly and avoid going anywhere that is not necessary.  KEEP IN MIND THAT THE COVID TEST IS NOT 100% ACCURATE AND YOU SHOULD STILL DO EVERYTHING TO PREVENT POTENTIAL SPREAD OF VIRUS TO OTHERS (WEAR MASK, WEAR GLOVES, WASH HANDS AND SANITIZE REGULARLY). IF INITIAL TEST IS NEGATIVE, THIS MAY NOT MEAN YOU ARE DEFINITELY NEGATIVE. MOST ACCURATE TESTING IS DONE 5-7 DAYS AFTER EXPOSURE.   It is not advised by CDC to get re-tested after receiving a positive COVID test since you can still test positive for weeks to months after you have already cleared the virus.   *If you have not been vaccinated for COVID, I strongly suggest you consider getting vaccinated as long as there are no contraindications.      ED Prescriptions   None    PDMP not reviewed this encounter.   Shirlee Latch, PA-C 02/14/21 1843

## 2021-02-14 NOTE — ED Triage Notes (Signed)
Pt here with feeling under the weather since Saturday, started with sore throat, cough, headaches, body aches, no fever. Here with C/O pressure in his head.

## 2021-02-15 LAB — SARS CORONAVIRUS 2 (TAT 6-24 HRS): SARS Coronavirus 2: NEGATIVE

## 2021-06-28 ENCOUNTER — Ambulatory Visit (HOSPITAL_BASED_OUTPATIENT_CLINIC_OR_DEPARTMENT_OTHER)
Admit: 2021-06-28 | Discharge: 2021-06-28 | Disposition: A | Discharge status: Home / Self Care | Payer: Self-pay | Attending: Internal Medicine | Admitting: Internal Medicine

## 2021-06-28 ENCOUNTER — Other Ambulatory Visit: Payer: Self-pay

## 2021-06-28 ENCOUNTER — Encounter (HOSPITAL_COMMUNITY): Payer: Self-pay

## 2021-06-28 ENCOUNTER — Ambulatory Visit (HOSPITAL_COMMUNITY)
Admission: EM | Admit: 2021-06-28 | Discharge: 2021-06-28 | Disposition: A | Discharge status: Home / Self Care | Payer: Self-pay | Attending: Internal Medicine | Admitting: Internal Medicine

## 2021-06-28 DIAGNOSIS — M79604 Pain in right leg: Secondary | ICD-10-CM

## 2021-06-28 DIAGNOSIS — M7989 Other specified soft tissue disorders: Secondary | ICD-10-CM

## 2021-06-28 DIAGNOSIS — M79605 Pain in left leg: Secondary | ICD-10-CM

## 2021-06-28 DIAGNOSIS — R600 Localized edema: Secondary | ICD-10-CM | POA: Insufficient documentation

## 2021-06-28 LAB — POCT URINALYSIS DIPSTICK, ED / UC
Bilirubin Urine: NEGATIVE
Glucose, UA: NEGATIVE mg/dL
Ketones, ur: NEGATIVE mg/dL
Leukocytes,Ua: NEGATIVE
Nitrite: NEGATIVE
Protein, ur: 100 mg/dL — AB
Specific Gravity, Urine: 1.015 (ref 1.005–1.030)
Urobilinogen, UA: 0.2 mg/dL (ref 0.0–1.0)
pH: 6 (ref 5.0–8.0)

## 2021-06-28 LAB — COMPREHENSIVE METABOLIC PANEL
ALT: 30 U/L (ref 0–44)
AST: 28 U/L (ref 15–41)
Albumin: 3.8 g/dL (ref 3.5–5.0)
Alkaline Phosphatase: 59 U/L (ref 38–126)
Anion gap: 6 (ref 5–15)
BUN: 10 mg/dL (ref 6–20)
CO2: 31 mmol/L (ref 22–32)
Calcium: 9.1 mg/dL (ref 8.9–10.3)
Chloride: 101 mmol/L (ref 98–111)
Creatinine, Ser: 1.32 mg/dL — ABNORMAL HIGH (ref 0.61–1.24)
GFR, Estimated: >60 mL/min (ref >60)
Glucose, Bld: 141 mg/dL — ABNORMAL HIGH (ref 70–99)
Potassium: 3.8 mmol/L (ref 3.5–5.1)
Sodium: 138 mmol/L (ref 135–145)
Total Bilirubin: 0.3 mg/dL (ref 0.3–1.2)
Total Protein: 6.8 g/dL (ref 6.5–8.1)

## 2021-06-28 LAB — BRAIN NATRIURETIC PEPTIDE: B Natriuretic Peptide: 16.9 pg/mL (ref 0.0–100.0)

## 2021-06-28 NOTE — ED Triage Notes (Signed)
Pt c/o bilateral lower extremity swelling for a few months.  ?

## 2021-06-28 NOTE — ED Provider Notes (Signed)
?[facility] ? ? ? ?CSN: [id number] ?Arrival date & time: 06/28/21  1143 ? ? ?  ? ?History   ?Chief Complaint ?Chief Complaint  ?Patient presents with  ? Leg Swelling  ? ? ?HPI ?Brian Mclean is a 36 y.o. male.  ? ?R>L Leg swelling ?Reports over the last 3 to 4 months he has been noticing leg swelling in his bilateral legs ?States that the swelling in his left leg comes and goes, but the right leg is constantly swollen ?States that it feels very tight and can sometimes be painful ?Usually worsens with being up on his feet and improves throughout the night ?No prior history of blood clots or family history of blood clots to his knowledge ?Denies chest pain, difficulty breathing ?Denies a history of hypertension ?Denies any known medical problems ?Does not currently have a PCP ?Does not currently take any medications ? ? ?History reviewed. No pertinent past medical history. ? ?There are no problems to display for this patient. ? ? ?History reviewed. No pertinent surgical history. ? ? ? ? ?Home Medications   ? ?Prior to Admission medications   ?Medication Sig Start Date End Date Taking? Authorizing Provider  ?BAYER ASPIRIN PO Take by mouth.    [provider]  ?cyclobenzaprine (FLEXERIL) 10 MG tablet Take 1 tablet (10 mg total) by mouth 3 (three) times daily as needed for muscle spasms. 09/21/20   Valinda Hoar, NP  ?ibuprofen (ADVIL) 800 MG tablet Take 1 tablet (800 mg total) by mouth 3 (three) times daily. 09/21/20   Valinda Hoar, NP  ?traMADol (ULTRAM) 50 MG tablet Take 1 tablet (50 mg total) by mouth every 12 (twelve) hours as needed for moderate pain or severe pain. 02/15/20   Lamptey, Britta Mccreedy, MD  ?albuterol (PROVENTIL HFA;VENTOLIN HFA) 108 (90 Base) MCG/ACT inhaler Inhale 1-2 puffs into the lungs every 6 (six) hours as needed for wheezing or shortness of breath. 01/21/18 02/15/20  Janace Aris, NP  ? ? ?Family History ?Family History  ?Problem Relation Age of Onset  ? Hypertension Other    ? Stroke Other   ? CAD Other   ? Diabetes Other   ? Asthma Other   ? ? ?Social History ?Social History  ? ?Tobacco Use  ? Smoking status: Some Days  ?  Types: Cigarettes  ?  Last attempt to quit: 12/27/2016  ?  Years since quitting: 4.5  ? Smokeless tobacco: Never  ?Vaping Use  ? Vaping Use: Some days  ?Substance Use Topics  ? Alcohol use: Never  ? Drug use: No  ? ? ? ?Allergies   ?Penicillins ? ? ?Review of Systems ?Review of Systems  ?All other systems reviewed and are negative. ? ?Per HPI ?Physical Exam ?Triage Vital Signs ?ED Triage Vitals  ?Enc Vitals Group  ?   BP 06/28/21 1205 134/88  ?   Pulse Rate 06/28/21 1205 92  ?   Resp 06/28/21 1205 20  ?   Temp 06/28/21 1205 98.4 ?F (36.9 ?C)  ?   Temp Source 06/28/21 1205 Oral  ?   SpO2 06/28/21 1205 95 %  ?   Weight --   ?   Height --   ?   Head Circumference --   ?   Peak Flow --   ?   Pain Score 06/28/21 1204 9  ?   Pain Loc --   ?   Pain Edu? --   ?   Excl. in GC? --   ? ?  No data found. ? ?Updated Vital Signs ?BP 134/88 (BP Location: Right Arm)   Pulse 92   Temp 98.4 ?F (36.9 ?C) (Oral)   Resp 20   SpO2 95%  ? ?Visual Acuity ?Right Eye Distance:   ?Left Eye Distance:   ?Bilateral Distance:   ? ?Right Eye Near:   ?Left Eye Near:    ?Bilateral Near:    ? ?Physical Exam ?Constitutional:   ?   General: He is not in acute distress. ?   Appearance: Normal appearance. He is not ill-appearing.  ?HENT:  ?   Head: Normocephalic and atraumatic.  ?Eyes:  ?   Conjunctiva/sclera: Conjunctivae normal.  ?Cardiovascular:  ?   Rate and Rhythm: Normal rate and regular rhythm.  ?   Heart sounds: No murmur heard. ?  No friction rub. No gallop.  ?Pulmonary:  ?   Effort: Pulmonary effort is normal. No respiratory distress.  ?   Breath sounds: No wheezing, rhonchi or rales.  ?Musculoskeletal:  ?   Cervical back: Normal range of motion.  ?   Right lower leg: Edema present.  ?   Left lower leg: Edema present.  ?   Comments: Nonpitting edema of the bilateral lower extremities, right  worse than left ?Varicosities noted throughout bilateral lower extremities ?Neurovascular intact distally in his bilateral lower extremities  ?Skin: ?   General: Skin is warm and dry.  ?   Capillary Refill: Capillary refill takes less than 2 seconds.  ?Neurological:  ?   Mental Status: He is alert and oriented to person, place, and time.  ?Psychiatric:     ?   Mood and Affect: Mood normal.     ?   Behavior: Behavior normal.  ? ? ? ?UC Treatments / Results  ?Labs ?(all labs ordered are listed, but only abnormal results are displayed) ?Labs Reviewed  ?POCT URINALYSIS DIPSTICK, ED / UC - Abnormal; Notable for the following components:  ?    Result Value  ? Hgb urine dipstick TRACE (*)   ? Protein, ur 100 (*)   ? All other components within normal limits  ?COMPREHENSIVE METABOLIC PANEL  ?BRAIN NATRIURETIC PEPTIDE  ? ? ?EKG ? ? ?Radiology ?No results found. ? ?Procedures ?Procedures (including critical care time) ? ?Medications Ordered in UC ?Medications - No data to display ? ?Initial Impression / Assessment and Plan / UC Course  ?I have reviewed the triage vital signs and the nursing notes. ? ?Pertinent labs & imaging results that were available during my care of the patient were reviewed by me and considered in my medical decision making (see chart for details). ? ?  ? ?We will start work-up for chronic right greater than left bilateral lower extremity edema.  Low risk for DVT, however we will go ahead and obtain bilateral DVT ultrasounds to rule out clot given that his right leg is slightly larger than his left and he reportedly has more fluctuations in the left leg than the right.  We will obtain CMP, BNP.  POC UA todaywith 100 protein and trace hemoglobin, recommend f/u with PCP, but BP is stable today and does not require emergent intervention.  Educated on how to obtain a PCP and recommended follow-up with them for further management.  Given ED precautions, see AVS.  Reassured that blood pressure today is normal  and both heart and lung exam are normal, no evidence of fluid overload on examination today.  Most likely related to venous insufficiency and being overweight. ?Final Clinical  Impressions(s) / UC Diagnoses  ? ?Final diagnoses:  ?Bilateral lower extremity edema  ? ? ? ?Discharge Instructions   ? ?  ?Your urine test today did show some protein and a small amount of blood which should be worked up by your new primary care provider.  We will get you scheduled for ultrasounds of your lower legs to make sure that you do not have a blood clot.  Go to the emergency room immediately if you develop chest pain or difficulty breathing.  As we discussed, your blood work collected today will likely be back tomorrow and someone will contact you with these results if you need to be treated.  If all this is normal, it may be that your swelling is related to your veins having difficulty getting the blood back to your heart.  Compression stockings may be helpful.  I recommend follow-up with a primary care provider however. ? ?In order to get a primary care provider, please go to [email or web address] to schedule an appointment for a new patient visit. ? ? ? ? ?ED Prescriptions   ?None ?  ? ?PDMP not reviewed this encounter. ?  ?Unknown JimMeccariello, Dafne Nield J, DO ?06/28/21 1254 ? ?

## 2021-06-28 NOTE — Discharge Instructions (Addendum)
Your urine test today did show some protein and a small amount of blood which should be worked up by your new primary care provider.  We will get you scheduled for ultrasounds of your lower legs to make sure that you do not have a blood clot.  Go to the emergency room immediately if you develop chest pain or difficulty breathing.  As we discussed, your blood work collected today will likely be back tomorrow and someone will contact you with these results if you need to be treated.  If all this is normal, it may be that your swelling is related to your veins having difficulty getting the blood back to your heart.  Compression stockings may be helpful.  I recommend follow-up with a primary care provider however. ? ?In order to get a primary care provider, please go to [email or web address] to schedule an appointment for a new patient visit. ?

## 2022-04-02 ENCOUNTER — Emergency Department
Admission: EM | Admit: 2022-04-02 | Discharge: 2022-04-02 | Disposition: A | Discharge status: Home / Self Care | Payer: Self-pay | Attending: Emergency Medicine | Admitting: Emergency Medicine

## 2022-04-02 ENCOUNTER — Other Ambulatory Visit: Payer: Self-pay

## 2022-04-02 DIAGNOSIS — J069 Acute upper respiratory infection, unspecified: Secondary | ICD-10-CM | POA: Insufficient documentation

## 2022-04-02 DIAGNOSIS — Z1152 Encounter for screening for COVID-19: Secondary | ICD-10-CM | POA: Insufficient documentation

## 2022-04-02 DIAGNOSIS — J101 Influenza due to other identified influenza virus with other respiratory manifestations: Secondary | ICD-10-CM | POA: Insufficient documentation

## 2022-04-02 DIAGNOSIS — J111 Influenza due to unidentified influenza virus with other respiratory manifestations: Secondary | ICD-10-CM

## 2022-04-02 LAB — RESP PANEL BY RT-PCR (RSV, FLU A&B, COVID)  RVPGX2
Influenza A by PCR: NEGATIVE
Influenza B by PCR: NEGATIVE
Resp Syncytial Virus by PCR: NEGATIVE
SARS Coronavirus 2 by RT PCR: NEGATIVE

## 2022-04-02 NOTE — ED Provider Notes (Signed)
   [facility] Provider Note    Event Date/Time   First MD Initiated Contact with Patient 04/02/22 1210     (approximate)   History   URI   HPI  Brian Mclean is a 36 y.o. male who presents with bodyaches, fever, headache, sore throat, mild cough.  He reports symptom started overnight.  Spouse has similar symptoms.     Physical Exam   Triage Vital Signs: ED Triage Vitals  Enc Vitals Group     BP 04/02/22 1200 (!) 135/106     Pulse Rate 04/02/22 1200 (!) 104     Resp 04/02/22 1200 20     Temp 04/02/22 1200 (!) 101.4 F (38.6 C)     Temp Source 04/02/22 1200 Oral     SpO2 04/02/22 1200 97 %     Weight 04/02/22 1201 (!) 154.2 kg (340 lb)     Height 04/02/22 1201 1.803 m (5\' 11" )     Head Circumference --      Peak Flow --      Pain Score 04/02/22 1201 9     Pain Loc --      Pain Edu? --      Excl. in GC? --     Most recent vital signs: Vitals:   04/02/22 1200  BP: (!) 135/106  Pulse: (!) 104  Resp: 20  Temp: (!) 101.4 F (38.6 C)  SpO2: 97%     General: Awake, no distress.  CV:  Good peripheral perfusion.  Resp:  Normal effort.  Abd:  No distention.  Other:     ED Results / Procedures / Treatments   Labs (all labs ordered are listed, but only abnormal results are displayed) Labs Reviewed  RESP PANEL BY RT-PCR (RSV, FLU A&B, COVID)  [id number]     EKG     RADIOLOGY     PROCEDURES:  Critical Care performed:   Procedures   MEDICATIONS ORDERED IN ED: Medications - No data to display   IMPRESSION / MDM / ASSESSMENT AND PLAN / ED COURSE  I reviewed the triage vital signs and the nursing notes. Patient's presentation is most consistent with acute, uncomplicated illness.  Patient presents with symptoms consistent with viral illness.  Differential includes COVID, influenza, other viral illness.  PCR result,  No indication for Tamiflu or Paxlovid given young healthy patients, recommend supportive care,  outpatient follow-up        FINAL CLINICAL IMPRESSION(S) / ED DIAGNOSES   Final diagnoses:  Influenza-like illness     Rx / DC Orders   ED Discharge Orders     None        Note:  This document was prepared using Dragon voice recognition software and may include unintentional dictation errors.   14/11/23, MD 04/02/22 1304

## 2022-04-02 NOTE — ED Triage Notes (Signed)
Complains of waking up with headache dizziness and went to work and then started having n/v and body aches.  Spouse has same symptoms.

## 2022-04-02 NOTE — ED Provider Triage Note (Signed)
Emergency Medicine Provider Triage Evaluation Note  Brian Mclean , a 36 y.o. male  was evaluated in triage.  Pt complains of cough congestion, fever, nausea and vomiting, diarrhea.  Review of Systems  Positive:  Negative:   Physical Exam  BP (!) 135/106 (BP Location: Left Arm)   Pulse (!) 104   Temp (!) 101.4 F (38.6 C) (Oral)   Resp 20   Ht 5\' 11"  (1.803 m)   Wt (!) 154.2 kg   SpO2 97%   BMI 47.42 kg/m  Gen:   Awake, no distress   Resp:  Normal effort  MSK:   Moves extremities without difficulty  Other:    Medical Decision Making  Medically screening exam initiated at 12:05 PM.  Appropriate orders placed.  was informed that the remainder of the evaluation will be completed by another provider, this initial triage assessment does not replace that evaluation, and the importance of remaining in the ED until their evaluation is complete.     [name], PA-C 04/02/22 1205

## 2022-04-02 NOTE — ED Notes (Signed)
First Nurse Note: Pt to ED via POV for flu like symptoms since this morning around 4 or 5 am. Pt is in NAD.

## 2022-07-23 ENCOUNTER — Emergency Department (HOSPITAL_COMMUNITY): Payer: BC Managed Care – PPO

## 2022-07-23 ENCOUNTER — Encounter (HOSPITAL_COMMUNITY): Payer: Self-pay

## 2022-07-23 ENCOUNTER — Other Ambulatory Visit: Payer: Self-pay

## 2022-07-23 ENCOUNTER — Inpatient Hospital Stay (HOSPITAL_COMMUNITY)
Admission: EM | Admit: 2022-07-23 | Discharge: 2022-07-25 | DRG: 354 | Disposition: A | Discharge status: Home / Self Care | Payer: BC Managed Care – PPO | Attending: Surgery | Admitting: Surgery

## 2022-07-23 DIAGNOSIS — F17210 Nicotine dependence, cigarettes, uncomplicated: Secondary | ICD-10-CM | POA: Diagnosis not present

## 2022-07-23 DIAGNOSIS — E669 Obesity, unspecified: Secondary | ICD-10-CM | POA: Diagnosis present

## 2022-07-23 DIAGNOSIS — Z8249 Family history of ischemic heart disease and other diseases of the circulatory system: Secondary | ICD-10-CM

## 2022-07-23 DIAGNOSIS — Z6841 Body Mass Index (BMI) 40.0 and over, adult: Secondary | ICD-10-CM | POA: Diagnosis not present

## 2022-07-23 DIAGNOSIS — Z880 Allergy status to penicillin: Secondary | ICD-10-CM | POA: Diagnosis not present

## 2022-07-23 DIAGNOSIS — Z833 Family history of diabetes mellitus: Secondary | ICD-10-CM | POA: Diagnosis not present

## 2022-07-23 DIAGNOSIS — K654 Sclerosing mesenteritis: Secondary | ICD-10-CM | POA: Diagnosis not present

## 2022-07-23 DIAGNOSIS — Z823 Family history of stroke: Secondary | ICD-10-CM | POA: Diagnosis not present

## 2022-07-23 DIAGNOSIS — K436 Other and unspecified ventral hernia with obstruction, without gangrene: Secondary | ICD-10-CM | POA: Diagnosis not present

## 2022-07-23 DIAGNOSIS — K439 Ventral hernia without obstruction or gangrene: Secondary | ICD-10-CM | POA: Diagnosis not present

## 2022-07-23 DIAGNOSIS — Z825 Family history of asthma and other chronic lower respiratory diseases: Secondary | ICD-10-CM

## 2022-07-23 DIAGNOSIS — R109 Unspecified abdominal pain: Secondary | ICD-10-CM | POA: Diagnosis not present

## 2022-07-23 DIAGNOSIS — K430 Incisional hernia with obstruction, without gangrene: Secondary | ICD-10-CM | POA: Diagnosis not present

## 2022-07-23 DIAGNOSIS — K429 Umbilical hernia without obstruction or gangrene: Secondary | ICD-10-CM | POA: Diagnosis not present

## 2022-07-23 DIAGNOSIS — K5900 Constipation, unspecified: Secondary | ICD-10-CM | POA: Diagnosis present

## 2022-07-23 DIAGNOSIS — F17200 Nicotine dependence, unspecified, uncomplicated: Secondary | ICD-10-CM | POA: Diagnosis not present

## 2022-07-23 DIAGNOSIS — K760 Fatty (change of) liver, not elsewhere classified: Secondary | ICD-10-CM | POA: Diagnosis not present

## 2022-07-23 DIAGNOSIS — Z79899 Other long term (current) drug therapy: Secondary | ICD-10-CM | POA: Diagnosis not present

## 2022-07-23 DIAGNOSIS — K661 Hemoperitoneum: Secondary | ICD-10-CM | POA: Diagnosis not present

## 2022-07-23 LAB — URINALYSIS, ROUTINE W REFLEX MICROSCOPIC
Bacteria, UA: NONE SEEN
Bilirubin Urine: NEGATIVE
Glucose, UA: NEGATIVE mg/dL
Hgb urine dipstick: NEGATIVE
Ketones, ur: NEGATIVE mg/dL
Leukocytes,Ua: NEGATIVE
Nitrite: NEGATIVE
Protein, ur: 30 mg/dL — AB
Specific Gravity, Urine: >1.046 — ABNORMAL HIGH (ref 1.005–1.030)
pH: 5 (ref 5.0–8.0)

## 2022-07-23 LAB — CBC
HCT: 47.1 % (ref 39.0–52.0)
Hemoglobin: 15.3 g/dL (ref 13.0–17.0)
MCH: 25.8 pg — ABNORMAL LOW (ref 26.0–34.0)
MCHC: 32.5 g/dL (ref 30.0–36.0)
MCV: 79.6 fL — ABNORMAL LOW (ref 80.0–100.0)
Platelets: 229 10*3/uL (ref 150–400)
RBC: 5.92 MIL/uL — ABNORMAL HIGH (ref 4.22–5.81)
RDW: 13.9 % (ref 11.5–15.5)
WBC: 7.6 10*3/uL (ref 4.0–10.5)
nRBC: 0 % (ref 0.0–0.2)

## 2022-07-23 LAB — COMPREHENSIVE METABOLIC PANEL
ALT: 26 U/L (ref 0–44)
AST: 24 U/L (ref 15–41)
Albumin: 3.6 g/dL (ref 3.5–5.0)
Alkaline Phosphatase: 58 U/L (ref 38–126)
Anion gap: 8 (ref 5–15)
BUN: 17 mg/dL (ref 6–20)
CO2: 25 mmol/L (ref 22–32)
Calcium: 8.9 mg/dL (ref 8.9–10.3)
Chloride: 103 mmol/L (ref 98–111)
Creatinine, Ser: 1.17 mg/dL (ref 0.61–1.24)
GFR, Estimated: >60 mL/min (ref >60)
Glucose, Bld: 107 mg/dL — ABNORMAL HIGH (ref 70–99)
Potassium: 4.1 mmol/L (ref 3.5–5.1)
Sodium: 136 mmol/L (ref 135–145)
Total Bilirubin: 0.7 mg/dL (ref 0.3–1.2)
Total Protein: 6.8 g/dL (ref 6.5–8.1)

## 2022-07-23 LAB — LIPASE, BLOOD: Lipase: 32 U/L (ref 11–51)

## 2022-07-23 MED ORDER — ONDANSETRON 4 MG PO TBDP: 4.0000 mg | ORAL_TABLET | Freq: Four times a day (QID) | ORAL | Status: DC | PRN

## 2022-07-23 MED ORDER — OXYCODONE HCL 5 MG PO TABS: 5.0000 mg | ORAL_TABLET | ORAL | Status: DC | PRN

## 2022-07-23 MED ORDER — MORPHINE SULFATE (PF) 2 MG/ML IV SOLN: 2.0000 mg | INTRAVENOUS | Status: DC | PRN

## 2022-07-23 MED ORDER — SODIUM CHLORIDE 0.9 % IV SOLN: INTRAVENOUS | Status: DC

## 2022-07-23 MED ORDER — HYDRALAZINE HCL 20 MG/ML IJ SOLN: 10.0000 mg | INTRAMUSCULAR | Status: DC | PRN

## 2022-07-23 MED ORDER — MELATONIN 3 MG PO TABS: 3.0000 mg | ORAL_TABLET | Freq: Every evening | ORAL | Status: DC | PRN

## 2022-07-23 MED ORDER — ONDANSETRON HCL 4 MG/2ML IJ SOLN: 4.0000 mg | Freq: Four times a day (QID) | INTRAMUSCULAR | Status: DC | PRN

## 2022-07-23 MED ORDER — NICOTINE 14 MG/24HR TD PT24
14.0000 mg | MEDICATED_PATCH | Freq: Every day | TRANSDERMAL | Status: DC
  Administered 2022-07-23 – 2022-07-25 (×3): 14 mg via TRANSDERMAL
  Filled 2022-07-23 (×3): qty 1

## 2022-07-23 MED ORDER — ACETAMINOPHEN 500 MG PO TABS: 1000.0000 mg | ORAL_TABLET | Freq: Four times a day (QID) | ORAL | Status: DC | PRN

## 2022-07-23 MED ORDER — ENOXAPARIN SODIUM 40 MG/0.4ML IJ SOSY
40.0000 mg | PREFILLED_SYRINGE | INTRAMUSCULAR | Status: DC
  Administered 2022-07-23 – 2022-07-24 (×2): 40 mg via SUBCUTANEOUS
  Filled 2022-07-23 (×2): qty 0.4

## 2022-07-23 MED ORDER — METHOCARBAMOL 1000 MG/10ML IJ SOLN: 500.0000 mg | Freq: Three times a day (TID) | INTRAVENOUS | Status: DC | PRN

## 2022-07-23 MED ORDER — METHOCARBAMOL 500 MG PO TABS: 500.0000 mg | ORAL_TABLET | Freq: Three times a day (TID) | ORAL | Status: DC | PRN

## 2022-07-23 MED ORDER — SIMETHICONE 80 MG PO CHEW: 40.0000 mg | CHEWABLE_TABLET | Freq: Four times a day (QID) | ORAL | Status: DC | PRN

## 2022-07-23 MED ORDER — ONDANSETRON HCL 4 MG/2ML IJ SOLN
4.0000 mg | Freq: Once | INTRAMUSCULAR | Status: AC
Start: 2022-07-23 — End: 2022-07-23
  Administered 2022-07-23: 4 mg via INTRAVENOUS
  Filled 2022-07-23: qty 2

## 2022-07-23 MED ORDER — ACETAMINOPHEN 500 MG PO TABS: 1000.0000 mg | ORAL_TABLET | Freq: Four times a day (QID) | ORAL | Status: DC

## 2022-07-23 MED ORDER — DIPHENHYDRAMINE HCL 25 MG PO CAPS: 25.0000 mg | ORAL_CAPSULE | Freq: Four times a day (QID) | ORAL | Status: DC | PRN

## 2022-07-23 MED ORDER — IOHEXOL 350 MG/ML SOLN
75.0000 mL | Freq: Once | INTRAVENOUS | Status: AC | PRN
  Administered 2022-07-23: 75 mL via INTRAVENOUS

## 2022-07-23 MED ORDER — MORPHINE SULFATE (PF) 4 MG/ML IV SOLN
4.0000 mg | Freq: Once | INTRAVENOUS | Status: AC
  Administered 2022-07-23: 4 mg via INTRAVENOUS
  Filled 2022-07-23: qty 1

## 2022-07-23 MED ORDER — DIPHENHYDRAMINE HCL 50 MG/ML IJ SOLN: 25.0000 mg | Freq: Four times a day (QID) | INTRAMUSCULAR | Status: DC | PRN

## 2022-07-23 NOTE — ED Provider Notes (Signed)
Patient care assumed at shift handoff from [facility], [place]. Please see his note for full details  In short, 37 year old male patient presented to the emergency department with concern for a hernia.  Patient states he has had a persistent umbilical hernia but that on Friday his swelling became worse.  The area was red and the patient endorses significant pain.  He denies lifting anything heavy and denies other triggers.  Patient denies nausea, vomiting, other complaints. BP (!) 140/93   Pulse 72   Temp 98.2 F (36.8 C) (Oral)   Resp 18   Ht 5\' 11"  (1.803 m)   Wt (!) 154.2 kg   SpO2 98%   BMI 47.42 kg/m   Physical Exam  Procedures  Procedures  ED Course / MDM    Medical Decision Making Amount and/or Complexity of Data Reviewed Labs: ordered. Radiology: ordered.  Risk Prescription drug management. Decision regarding hospitalization.   Patient with incarcerated fat-containing ventral hernia.  General surgery placed admission orders.       Darrick Grinder, PA-C 07/23/22 1616    Pricilla Loveless, MD 07/27/22 [phone]

## 2022-07-23 NOTE — ED Provider Notes (Signed)
[facility] Provider Note   CSN: [id number] Arrival date & time: 07/23/22  [id number]     History  Chief Complaint  Patient presents with   Abdominal Pain    Brian Mclean is a 37 y.o. male.  37 year old male presents today for evaluation of concern for hernia.  He states he always had an umbilical hernia however since Friday the swelling has gotten worse.  It is also red.  And endorses significant pain.  Denies lifting anything heavy or other triggers.  Denies nausea, vomiting, or other complaints.  Overall he is well-appearing.  The history is provided by the patient. No language interpreter was used.       Home Medications Prior to Admission medications   Medication Sig Start Date End Date Taking? Authorizing Provider  BAYER ASPIRIN PO Take by mouth.    [provider]  cyclobenzaprine (FLEXERIL) 10 MG tablet Take 1 tablet (10 mg total) by mouth 3 (three) times daily as needed for muscle spasms. 09/21/20   White, Leitha Schuller, NP  ibuprofen (ADVIL) 800 MG tablet Take 1 tablet (800 mg total) by mouth 3 (three) times daily. 09/21/20   White, Leitha Schuller, NP  traMADol (ULTRAM) 50 MG tablet Take 1 tablet (50 mg total) by mouth every 12 (twelve) hours as needed for moderate pain or severe pain. 02/15/20   Lamptey, Myrene Galas, MD  albuterol (PROVENTIL HFA;VENTOLIN HFA) 108 (90 Base) MCG/ACT inhaler Inhale 1-2 puffs into the lungs every 6 (six) hours as needed for wheezing or shortness of breath. 01/21/18 02/15/20  Orvan July, NP      Allergies    Penicillins    Review of Systems   Review of Systems  Constitutional:  Negative for chills and fever.  Gastrointestinal:  Positive for abdominal pain. Negative for constipation, nausea and vomiting.  Neurological:  Negative for light-headedness.  All other systems reviewed and are negative.   Physical Exam Updated Vital Signs BP (!) 149/97 (BP Location: Right Arm)   Pulse 90   Temp (!)  97.5 F (36.4 C) (Oral)   Resp 18   Ht 5\' 11"  (1.803 m)   Wt (!) 154.2 kg   SpO2 94%   BMI 47.42 kg/m  Physical Exam Vitals and nursing note reviewed.  Constitutional:      General: He is not in acute distress.    Appearance: Normal appearance. He is not ill-appearing.  HENT:     Head: Normocephalic and atraumatic.     Nose: Nose normal.  Eyes:     Conjunctiva/sclera: Conjunctivae normal.  Pulmonary:     Effort: Pulmonary effort is normal. No respiratory distress.  Abdominal:     Tenderness: There is abdominal tenderness.     Comments: Umbilical hernia appreciated.  No overlying erythema.  Mild reducible.  Tenderness to palpation present.  Musculoskeletal:        General: No deformity.  Skin:    Findings: No rash.  Neurological:     Mental Status: He is alert.     ED Results / Procedures / Treatments   Labs (all labs ordered are listed, but only abnormal results are displayed) Labs Reviewed  COMPREHENSIVE METABOLIC PANEL - Abnormal; Notable for the following components:      Result Value   Glucose, Bld 107 (*)    All other components within normal limits  CBC - Abnormal; Notable for the following components:   RBC 5.92 (*)    MCV 79.6 (*)  MCH 25.8 (*)    All other components within normal limits  LIPASE, BLOOD  URINALYSIS, ROUTINE W REFLEX MICROSCOPIC    EKG None  Radiology No results found.  Procedures Procedures    Medications Ordered in ED Medications  ondansetron (ZOFRAN) injection 4 mg (has no administration in time range)  morphine (PF) 4 MG/ML injection 4 mg (has no administration in time range)    ED Course/ Medical Decision Making/ A&P                             Medical Decision Making Amount and/or Complexity of Data Reviewed Labs: ordered. Radiology: ordered.  Risk Prescription drug management.   Medical Decision Making / ED Course   This patient presents to the ED for concern of abdominal pain, this involves an extensive  number of treatment options, and is a complaint that carries with it a high risk of complications and morbidity.  The differential diagnosis includes incarcerated umbilical hernia, strangulated umbilical hernia, bowel obstruction, pancreatitis  MDM: 37 year old male presents today for evaluation of worsening swelling in his abdomen just above the umbilicus.  Has history of umbilical hernia.  However swelling has gotten worse.  Hernia is nonreproducible on exam.  Overlying erythema present.  Rest of the abdomen is soft, and without tenderness.  CBC is without leukocytosis, or anemia.  CMP shows glucose 107 otherwise unremarkable.  Lipase within normal limits.  Will provide pain control, obtain CT abdomen pelvis to evaluate for incarceration or obstruction.  CT shows evidence of incarceration.  However fat-containing only.  Discussed with surgery.  Discussed with PA Brooke.  She will discuss with her attending given patient does have erythema and warmth however he is without fever and white count.  They will call us back with their recommendations.  Patient signed out to oncoming provider.  Lab Tests: -I ordered, reviewed, and interpreted labs.   The pertinent results include:   Labs Reviewed  COMPREHENSIVE METABOLIC PANEL - Abnormal; Notable for the following components:      Result Value   Glucose, Bld 107 (*)    All other components within normal limits  CBC - Abnormal; Notable for the following components:   RBC 5.92 (*)    MCV 79.6 (*)    MCH 25.8 (*)    All other components within normal limits  LIPASE, BLOOD  URINALYSIS, ROUTINE W REFLEX MICROSCOPIC      EKG  EKG Interpretation  Date/Time:    Ventricular Rate:    PR Interval:    QRS Duration:   QT Interval:    QTC Calculation:   R Axis:     Text Interpretation:           Imaging Studies ordered: I ordered imaging studies including CT abdomen pelvis with contrast ordered but has not resulted prior to the end of my  shift. I independently visualized and interpreted imaging. I agree with the radiologist interpretation   Medicines ordered and prescription drug management: Meds ordered this encounter  Medications   ondansetron (ZOFRAN) injection 4 mg   morphine (PF) 4 MG/ML injection 4 mg   iohexol (OMNIPAQUE) 350 MG/ML injection 75 mL    -I have reviewed the patients home medicines and have made adjustments as needed  Critical interventions Pain control   Reevaluation: After the interventions noted above, I reevaluated the patient and found that they have :stayed the same  Co morbidities that complicate the patient evaluation  History reviewed. No pertinent past medical history.    Dispostion: Patient signed out to oncoming provider.  Final Clinical Impression(s) / ED Diagnoses Final diagnoses:  Ventral hernia without obstruction or gangrene    Rx / DC Orders ED Discharge Orders     None         Evlyn Courier, PA-C 07/23/22 1538    Regan Lemming, MD 07/23/22 2003

## 2022-07-23 NOTE — H&P (Signed)
Brian Mclean Admission Note  Brian Mclean 22-Jun-1985  [id number].    Requesting MD: Sherwood Gambler Chief Complaint/Reason for Consult: ventral hernia  HPI:  Brian Mclean is a 37 y.o. male PMH tobacco abuse and obesity who presented to [facility] complaining of worsening pain at site of known ventral hernia. States that he had a ventral and inguinal hernia repair as a child. States that the ventral hernia recurred when he was a teenager but never really bothered him much until more recently. States that it will intermittently swell but generally resolve on its own. States that Friday he noticed persistent and worsening swelling at the site of the hernia. He developed some erythema 2 days ago. Denies nausea, vomiting, fever, chills. He does report feeling constipated and took some miralax which helped. He has passed flatus today and is tolerating a diet. CT scan today shows an incarcerated fat containing midline supraumbilical ventral hernia; small uncomplicated fat containing umbilical hernia.  General Mclean asked to see.  Anticoagulants: none Smokes 1/4 PPD Drinks alcohol occasionally Admits to [facility] use, otherwise denies illicit drug use Employment: drives a Forensic scientist   Family History  Problem Relation Age of Onset   Hypertension Other    Stroke Other    CAD Other    Diabetes Other    Asthma Other     History reviewed. No pertinent past medical history.  History reviewed. No pertinent surgical history.  Social History:  reports that he has been smoking cigarettes. He has never used smokeless tobacco. He reports that he does not drink alcohol and does not use drugs.  Allergies:  Allergies  Allergen Reactions   Penicillins Swelling    Has patient had a PCN reaction causing immediate rash, facial/tongue/throat swelling, SOB or lightheadedness with hypotension: Unknown Has patient had a PCN reaction causing severe rash involving mucus membranes or skin necrosis:  Unknown Has patient had a PCN reaction that required hospitalization: Unknown Has patient had a PCN reaction occurring within the last 10 years: No If all of the above answers are "NO", then may proceed with Cephalosporin use.     (Not in a hospital admission)   Prior to Admission medications   Medication Sig Start Date End Date Taking? Authorizing Provider  BAYER ASPIRIN PO Take by mouth.    [provider]  cyclobenzaprine (FLEXERIL) 10 MG tablet Take 1 tablet (10 mg total) by mouth 3 (three) times daily as needed for muscle spasms. 09/21/20   White, Leitha Schuller, NP  ibuprofen (ADVIL) 800 MG tablet Take 1 tablet (800 mg total) by mouth 3 (three) times daily. 09/21/20   White, Leitha Schuller, NP  traMADol (ULTRAM) 50 MG tablet Take 1 tablet (50 mg total) by mouth every 12 (twelve) hours as needed for moderate pain or severe pain. 02/15/20   Lamptey, Myrene Galas, MD  albuterol (PROVENTIL HFA;VENTOLIN HFA) 108 (90 Base) MCG/ACT inhaler Inhale 1-2 puffs into the lungs every 6 (six) hours as needed for wheezing or shortness of breath. 01/21/18 02/15/20  Loura Halt A, NP    Blood pressure (!) 140/93, pulse 72, temperature 98.2 F (36.8 C), temperature source Oral, resp. rate 18, height 5\' 11"  (1.803 m), weight (!) 154.2 kg, SpO2 98 %. Physical Exam: General: pleasant, WD/WN male who is sitting up in a chair in NAD HEENT: head is normocephalic, atraumatic.  Sclera are noninjected.  Pupils equal and round.  Ears and nose without any masses or lesions.  Mouth is pink and moist. Dentition  fair Heart: regular, rate, and rhythm Lungs: CTAB, no wheezes, rhonchi, or rales noted.  Respiratory effort nonlabored on room air Abd: soft, ND, +BS, incarcerated supraumbilical hernia with tenderness and overlying erythema MS: no BUE/BLE edema, calves soft and nontender Skin: warm and dry with no masses, lesions, or rashes Psych: A&Ox4 with an appropriate affect Neuro: MAEs, no gross motor or sensory deficits  BUE/BLE  Results for orders placed or performed during the hospital encounter of 07/23/22 (from the past 48 hour(s))  Lipase, blood     Status: None   Collection Time: 07/23/22 10:10 AM  Result Value Ref Range   Lipase 32 11 - 51 U/L    Comment: Performed at [facility], [address], [place], [place] [address]  Comprehensive metabolic panel     Status: Abnormal   Collection Time: 07/23/22 10:10 AM  Result Value Ref Range   Sodium 136 135 - 145 mmol/L   Potassium 4.1 3.5 - 5.1 mmol/L   Chloride 103 98 - 111 mmol/L   CO2 25 22 - 32 mmol/L   Glucose, Bld 107 (H) 70 - 99 mg/dL    Comment: Glucose reference range applies only to samples taken after fasting for at least 8 hours.   BUN 17 6 - 20 mg/dL   Creatinine, Ser 1.17 0.61 - 1.24 mg/dL   Calcium 8.9 8.9 - 10.3 mg/dL   Total Protein 6.8 6.5 - 8.1 g/dL   Albumin 3.6 3.5 - 5.0 g/dL   AST 24 15 - 41 U/L   ALT 26 0 - 44 U/L   Alkaline Phosphatase 58 38 - 126 U/L   Total Bilirubin 0.7 0.3 - 1.2 mg/dL   GFR, Estimated >60 >60 mL/min    Comment: (NOTE) Calculated using the CKD-EPI Creatinine Equation (2021)    Anion gap 8 5 - 15    Comment: Performed at [facility] [address] [place], [place] [address]  CBC     Status: Abnormal   Collection Time: 07/23/22 10:10 AM  Result Value Ref Range   WBC 7.6 4.0 - 10.5 K/uL   RBC 5.92 (H) 4.22 - 5.81 MIL/uL   Hemoglobin 15.3 13.0 - 17.0 g/dL   HCT 47.1 39.0 - 52.0 %   MCV 79.6 (L) 80.0 - 100.0 fL   MCH 25.8 (L) 26.0 - 34.0 pg   MCHC 32.5 30.0 - 36.0 g/dL   RDW 13.9 11.5 - 15.5 %   Platelets 229 150 - 400 K/uL   nRBC 0.0 0.0 - 0.2 %    Comment: Performed at [facility], [place] [address] [place], [place] [address]   CT ABDOMEN PELVIS W CONTRAST  Result Date: 07/23/2022 CLINICAL DATA:  Abdominal pain EXAM: CT ABDOMEN AND PELVIS WITH CONTRAST TECHNIQUE: Multidetector CT imaging of the abdomen and pelvis was performed using the standard protocol following bolus  administration of intravenous contrast. RADIATION DOSE REDUCTION: This exam was performed according to the departmental dose-optimization program which includes automated exposure control, adjustment of the mA and/or kV according to patient size and/or use of iterative reconstruction technique. CONTRAST:  33mL OMNIPAQUE IOHEXOL 350 MG/ML SOLN COMPARISON:  None Available. FINDINGS: Lower chest: No acute pleural or parenchymal lung disease. Hepatobiliary: Mild diffuse hepatic steatosis. No focal liver abnormality. Gallbladder is unremarkable. No biliary duct dilation. Pancreas: Unremarkable. No pancreatic ductal dilatation or surrounding inflammatory changes. Spleen: Normal in size without focal abnormality. Adrenals/Urinary Tract: Adrenal glands are unremarkable. Kidneys are normal, without renal  calculi, focal lesion, or hydronephrosis. Bladder is unremarkable. Stomach/Bowel: No bowel obstruction or ileus. Normal appendix right lower quadrant. No bowel wall thickening. Vascular/Lymphatic: No significant vascular findings are present. No enlarged abdominal or pelvic lymph nodes. Reproductive: Prostate is unremarkable. Other: There is a midline supraumbilical fat containing ventral hernia, with abdominal wall defect measuring 2.2 x 2.7 cm. Fluid and edema within the herniated fat concerning for incarcerated hernia. There is a second small fat containing umbilical hernia, without evidence of incarceration. No bowel herniation. No free intraperitoneal fluid or free gas. Musculoskeletal: No acute or destructive bony lesions. Reconstructed images demonstrate no additional findings. IMPRESSION: 1. Incarcerated fat containing midline supraumbilical ventral hernia. 2. Small uncomplicated fat containing umbilical hernia. 3. Mild hepatic steatosis. Electronically Signed   By: Randa Ngo M.D.   On: 07/23/2022 14:52      Assessment/Plan Incarcerated fat containing ventral hernia - CT scan shows an incarcerated fat  containing midline supraumbilical ventral hernia; small uncomplicated fat containing umbilical hernia - Supraumbilical hernia with incarcerated fat (no intestine) and some erythema overlying the hernia. Recommend admission with plans for open ventral hernia repair with possible mesh. Patient agreeable. Ok for diet tonight, NPO after midnight.  ID - on call to OR VTE - SCDs, lovenox FEN - reg diet, NPO after midnight Foley - none  Tobacco abuse Obesity BMI 47  I reviewed ED provider notes, last 24 h vitals and pain scores, last 48 h intake and output, last 24 h labs and trends, and last 24 h imaging results.   Wellington Hampshire, [place] Mclean 07/23/2022, 3:58 PM Please see Amion for pager number during day hours 7:00am-4:30pm

## 2022-07-23 NOTE — ED Triage Notes (Signed)
Reports umbilical hernia that is bothering him.  No nausea vomiting or diarrhea.

## 2022-07-23 NOTE — ED Notes (Signed)
ED TO INPATIENT HANDOFF REPORT  ED Nurse Name and Phone #:   S Name/Age/Gender Brian Mclean 37 y.o. male Room/Bed: H020C/H020C  Code Status   Code Status: Full Code  Home/SNF/Other Home Patient oriented to: self, place, time, and situation Is this baseline? Yes   Triage Complete: Triage complete  Chief Complaint Umbilical hernia [id number]  Triage Note Reports umbilical hernia that is bothering him.  No nausea vomiting or diarrhea.    Allergies Allergies  Allergen Reactions   Penicillins Swelling    Has patient had a PCN reaction causing immediate rash, facial/tongue/throat swelling, SOB or lightheadedness with hypotension: Unknown Has patient had a PCN reaction causing severe rash involving mucus membranes or skin necrosis: Unknown Has patient had a PCN reaction that required hospitalization: Unknown Has patient had a PCN reaction occurring within the last 10 years: No If all of the above answers are "NO", then may proceed with Cephalosporin use.     Level of Care/Admitting Diagnosis ED Disposition     ED Disposition  Admit   Condition  --   Comment  Hospital Area: [place] [[id number]]  Level of Care: Med-Surg [16]  May place patient in observation at [facility] or [facility] if equivalent level of care is available:: No  Covid Evaluation: Asymptomatic - no recent exposure (last 10 days) testing not required  Diagnosis: Umbilical hernia [id number]  Admitting Physician: Fishhook, Gowen  Attending Physician: [facility], Greene          B Medical/Surgery History History reviewed. No pertinent past medical history. History reviewed. No pertinent surgical history.   A IV Location/Drains/Wounds Patient Lines/Drains/Airways Status     Active Line/Drains/Airways     Name Placement date Placement time Site Days   Peripheral IV 07/23/22 20 G Anterior;Distal;Right;Upper Arm 07/23/22  1230  Arm  less than 1            Intake/Output Last  24 hours No intake or output data in the 24 hours ending 07/23/22 1626  Labs/Imaging Results for orders placed or performed during the hospital encounter of 07/23/22 (from the past 48 hour(s))  Lipase, blood     Status: None   Collection Time: 07/23/22 10:10 AM  Result Value Ref Range   Lipase 32 11 - 51 U/L    Comment: Performed at [facility], [place] [address] [place], [place] [address]  Comprehensive metabolic panel     Status: Abnormal   Collection Time: 07/23/22 10:10 AM  Result Value Ref Range   Sodium 136 135 - 145 mmol/L   Potassium 4.1 3.5 - 5.1 mmol/L   Chloride 103 98 - 111 mmol/L   CO2 25 22 - 32 mmol/L   Glucose, Bld 107 (H) 70 - 99 mg/dL    Comment: Glucose reference range applies only to samples taken after fasting for at least 8 hours.   BUN 17 6 - 20 mg/dL   Creatinine, Ser 1.17 0.61 - 1.24 mg/dL   Calcium 8.9 8.9 - 10.3 mg/dL   Total Protein 6.8 6.5 - 8.1 g/dL   Albumin 3.6 3.5 - 5.0 g/dL   AST 24 15 - 41 U/L   ALT 26 0 - 44 U/L   Alkaline Phosphatase 58 38 - 126 U/L   Total Bilirubin 0.7 0.3 - 1.2 mg/dL   GFR, Estimated >60 >60 mL/min    Comment: (NOTE) Calculated using the CKD-EPI Creatinine Equation (2021)    Anion gap 8 5 - 15  Comment: Performed at [facility], [place] [address] [place], [place] [address]  CBC     Status: Abnormal   Collection Time: 07/23/22 10:10 AM  Result Value Ref Range   WBC 7.6 4.0 - 10.5 K/uL   RBC 5.92 (H) 4.22 - 5.81 MIL/uL   Hemoglobin 15.3 13.0 - 17.0 g/dL   HCT 47.1 39.0 - 52.0 %   MCV 79.6 (L) 80.0 - 100.0 fL   MCH 25.8 (L) 26.0 - 34.0 pg   MCHC 32.5 30.0 - 36.0 g/dL   RDW 13.9 11.5 - 15.5 %   Platelets 229 150 - 400 K/uL   nRBC 0.0 0.0 - 0.2 %    Comment: Performed at [facility], [place] [address], [place], [place] [address]   CT ABDOMEN PELVIS W CONTRAST  Result Date: 07/23/2022 CLINICAL DATA:  Abdominal pain EXAM: CT ABDOMEN AND PELVIS WITH CONTRAST TECHNIQUE: Multidetector CT imaging of the  abdomen and pelvis was performed using the standard protocol following bolus administration of intravenous contrast. RADIATION DOSE REDUCTION: This exam was performed according to the departmental dose-optimization program which includes automated exposure control, adjustment of the mA and/or kV according to patient size and/or use of iterative reconstruction technique. CONTRAST:  37mL OMNIPAQUE IOHEXOL 350 MG/ML SOLN COMPARISON:  None Available. FINDINGS: Lower chest: No acute pleural or parenchymal lung disease. Hepatobiliary: Mild diffuse hepatic steatosis. No focal liver abnormality. Gallbladder is unremarkable. No biliary duct dilation. Pancreas: Unremarkable. No pancreatic ductal dilatation or surrounding inflammatory changes. Spleen: Normal in size without focal abnormality. Adrenals/Urinary Tract: Adrenal glands are unremarkable. Kidneys are normal, without renal calculi, focal lesion, or hydronephrosis. Bladder is unremarkable. Stomach/Bowel: No bowel obstruction or ileus. Normal appendix right lower quadrant. No bowel wall thickening. Vascular/Lymphatic: No significant vascular findings are present. No enlarged abdominal or pelvic lymph nodes. Reproductive: Prostate is unremarkable. Other: There is a midline supraumbilical fat containing ventral hernia, with abdominal wall defect measuring 2.2 x 2.7 cm. Fluid and edema within the herniated fat concerning for incarcerated hernia. There is a second small fat containing umbilical hernia, without evidence of incarceration. No bowel herniation. No free intraperitoneal fluid or free gas. Musculoskeletal: No acute or destructive bony lesions. Reconstructed images demonstrate no additional findings. IMPRESSION: 1. Incarcerated fat containing midline supraumbilical ventral hernia. 2. Small uncomplicated fat containing umbilical hernia. 3. Mild hepatic steatosis. Electronically Signed   By: Randa Ngo M.D.   On: 07/23/2022 14:52    Pending Labs Unresulted  Labs (From admission, onward)     Start     Ordered   07/24/22 [id number]  Basic metabolic panel  Tomorrow morning,   R        07/23/22 1605   07/24/22 0500  CBC  Tomorrow morning,   R        07/23/22 1605   07/23/22 1602  HIV Antibody (routine testing w rflx)  (HIV Antibody (Routine testing w reflex) panel)  Once,   R        07/23/22 1605   07/23/22 1006  Urinalysis, Routine w reflex microscopic -Urine, Clean Catch  Once,   URGENT       Question:  Specimen Source  Answer:  Urine, Clean Catch   07/23/22 1005            Vitals/Pain Today's Vitals   07/23/22 0953 07/23/22 1004 07/23/22 1249 07/23/22 1545  BP: (!) 149/97   (!) 140/93  Pulse: 90   72  Resp: 18   18  Temp: (!) 97.5 F (36.4 C)   98.2 F (36.8 C)  TempSrc: Oral   Oral  SpO2: 94%   98%  Weight:  (!) 154.2 kg    Height:  5\' 11"  (1.803 m)    PainSc:  8  8      Isolation Precautions No active isolations  Medications Medications  enoxaparin (LOVENOX) injection 40 mg (has no administration in time range)  0.9 %  sodium chloride infusion (has no administration in time range)  oxyCODONE (Oxy IR/ROXICODONE) immediate release tablet 5-10 mg (has no administration in time range)  morphine (PF) 2 MG/ML injection 2 mg (has no administration in time range)  methocarbamol (ROBAXIN) tablet 500 mg (has no administration in time range)    Or  methocarbamol (ROBAXIN) 500 mg in dextrose 5 % 50 mL IVPB (has no administration in time range)  melatonin tablet 3 mg (has no administration in time range)  diphenhydrAMINE (BENADRYL) capsule 25 mg (has no administration in time range)    Or  diphenhydrAMINE (BENADRYL) injection 25 mg (has no administration in time range)  ondansetron (ZOFRAN-ODT) disintegrating tablet 4 mg (has no administration in time range)    Or  ondansetron (ZOFRAN) injection 4 mg (has no administration in time range)  simethicone (MYLICON) chewable tablet 40 mg (has no administration in time range)  hydrALAZINE  (APRESOLINE) injection 10 mg (has no administration in time range)  acetaminophen (TYLENOL) tablet 1,000 mg (has no administration in time range)  nicotine (NICODERM CQ - dosed in mg/24 hours) patch 14 mg (has no administration in time range)  ondansetron (ZOFRAN) injection 4 mg (4 mg Intravenous Given 07/23/22 1241)  morphine (PF) 4 MG/ML injection 4 mg (4 mg Intravenous Given 07/23/22 1247)  iohexol (OMNIPAQUE) 350 MG/ML injection 75 mL (75 mLs Intravenous Contrast Given 07/23/22 1441)    Mobility walks     Focused Assessments   R Recommendations: See Admitting Provider Note  Report given to:   Additional Notes:

## 2022-07-23 NOTE — Anesthesia Preprocedure Evaluation (Signed)
Anesthesia Evaluation  Patient identified by MRN, date of birth, ID band Patient awake    Reviewed: Allergy & Precautions, H&P , NPO status , Patient's Chart, lab work & pertinent test results  Airway Mallampati: II  TM Distance: >3 FB Neck ROM: Full    Dental no notable dental hx.    Pulmonary Current Smoker   breath sounds clear to auscultation + decreased breath sounds      Cardiovascular negative cardio ROS Normal cardiovascular exam Rhythm:Regular Rate:Normal     Neuro/Psych negative neurological ROS  negative psych ROS   GI/Hepatic negative GI ROS, Neg liver ROS,,,  Endo/Other    Morbid obesity (BMI 47.4)  Renal/GU negative Renal ROSLab Results      Component                Value               Date                      CREATININE               1.17                07/23/2022                BUN                      17                  07/23/2022                NA                       136                 07/23/2022                K                        4.1                 07/23/2022                 negative genitourinary   Musculoskeletal negative musculoskeletal ROS (+)    Abdominal  (+) + obese  Peds negative pediatric ROS (+)  Hematology negative hematology ROS (+) On enoxaparin   Anesthesia Other Findings All: PCN  Reproductive/Obstetrics negative OB ROS                             Anesthesia Physical Anesthesia Plan  ASA: 3  Anesthesia Plan: General   Post-op Pain Management: Ketamine IV*, Tylenol PO (pre-op)* and Minimal or no pain anticipated   Induction: Intravenous  PONV Risk Score and Plan: Treatment may vary due to age or medical condition, Midazolam, Ondansetron and Dexamethasone  Airway Management Planned: Oral ETT  Additional Equipment: None  Intra-op Plan:   Post-operative Plan: Extubation in OR  Informed Consent:      Dental advisory  given  Plan Discussed with: Surgeon  Anesthesia Plan Comments:         Anesthesia Quick Evaluation

## 2022-07-24 ENCOUNTER — Observation Stay (HOSPITAL_COMMUNITY): Payer: BC Managed Care – PPO | Admitting: Anesthesiology

## 2022-07-24 ENCOUNTER — Encounter (HOSPITAL_COMMUNITY): Payer: Self-pay

## 2022-07-24 ENCOUNTER — Encounter (HOSPITAL_COMMUNITY): Admission: EM | Disposition: A | Discharge status: Home / Self Care | Payer: Self-pay | Source: Home / Self Care

## 2022-07-24 ENCOUNTER — Other Ambulatory Visit: Payer: Self-pay

## 2022-07-24 DIAGNOSIS — Z880 Allergy status to penicillin: Secondary | ICD-10-CM | POA: Diagnosis not present

## 2022-07-24 DIAGNOSIS — F17210 Nicotine dependence, cigarettes, uncomplicated: Secondary | ICD-10-CM | POA: Diagnosis present

## 2022-07-24 DIAGNOSIS — Z825 Family history of asthma and other chronic lower respiratory diseases: Secondary | ICD-10-CM | POA: Diagnosis not present

## 2022-07-24 DIAGNOSIS — K5900 Constipation, unspecified: Secondary | ICD-10-CM | POA: Diagnosis present

## 2022-07-24 DIAGNOSIS — K439 Ventral hernia without obstruction or gangrene: Secondary | ICD-10-CM | POA: Diagnosis present

## 2022-07-24 DIAGNOSIS — K429 Umbilical hernia without obstruction or gangrene: Secondary | ICD-10-CM | POA: Diagnosis present

## 2022-07-24 DIAGNOSIS — Z823 Family history of stroke: Secondary | ICD-10-CM | POA: Diagnosis not present

## 2022-07-24 DIAGNOSIS — K436 Other and unspecified ventral hernia with obstruction, without gangrene: Secondary | ICD-10-CM | POA: Diagnosis present

## 2022-07-24 DIAGNOSIS — Z8249 Family history of ischemic heart disease and other diseases of the circulatory system: Secondary | ICD-10-CM | POA: Diagnosis not present

## 2022-07-24 DIAGNOSIS — Z833 Family history of diabetes mellitus: Secondary | ICD-10-CM | POA: Diagnosis not present

## 2022-07-24 DIAGNOSIS — Z6841 Body Mass Index (BMI) 40.0 and over, adult: Secondary | ICD-10-CM | POA: Diagnosis not present

## 2022-07-24 DIAGNOSIS — E669 Obesity, unspecified: Secondary | ICD-10-CM | POA: Diagnosis present

## 2022-07-24 DIAGNOSIS — Z79899 Other long term (current) drug therapy: Secondary | ICD-10-CM | POA: Diagnosis not present

## 2022-07-24 HISTORY — PX: VENTRAL HERNIA REPAIR: SHX424

## 2022-07-24 LAB — CBC
HCT: 46 % (ref 39.0–52.0)
Hemoglobin: 14.9 g/dL (ref 13.0–17.0)
MCH: 25.9 pg — ABNORMAL LOW (ref 26.0–34.0)
MCHC: 32.4 g/dL (ref 30.0–36.0)
MCV: 80 fL (ref 80.0–100.0)
Platelets: 209 10*3/uL (ref 150–400)
RBC: 5.75 MIL/uL (ref 4.22–5.81)
RDW: 14.1 % (ref 11.5–15.5)
WBC: 5.9 10*3/uL (ref 4.0–10.5)
nRBC: 0 % (ref 0.0–0.2)

## 2022-07-24 LAB — BASIC METABOLIC PANEL
Anion gap: 11 (ref 5–15)
BUN: 13 mg/dL (ref 6–20)
CO2: 23 mmol/L (ref 22–32)
Calcium: 9 mg/dL (ref 8.9–10.3)
Chloride: 104 mmol/L (ref 98–111)
Creatinine, Ser: 1.16 mg/dL (ref 0.61–1.24)
GFR, Estimated: >60 mL/min (ref >60)
Glucose, Bld: 97 mg/dL (ref 70–99)
Potassium: 4.3 mmol/L (ref 3.5–5.1)
Sodium: 138 mmol/L (ref 135–145)

## 2022-07-24 LAB — SURGICAL PCR SCREEN
MRSA, PCR: NEGATIVE
Staphylococcus aureus: NEGATIVE

## 2022-07-24 LAB — HIV ANTIBODY (ROUTINE TESTING W REFLEX): HIV Screen 4th Generation wRfx: NONREACTIVE

## 2022-07-24 SURGERY — REPAIR, HERNIA, VENTRAL
Anesthesia: General | Site: Abdomen

## 2022-07-24 MED ORDER — LIDOCAINE 2% (20 MG/ML) 5 ML SYRINGE
INTRAMUSCULAR | Status: DC | PRN
  Administered 2022-07-24: 100 mg via INTRAVENOUS

## 2022-07-24 MED ORDER — SUGAMMADEX SODIUM 200 MG/2ML IV SOLN
INTRAVENOUS | Status: DC | PRN
  Administered 2022-07-24: 400 mg via INTRAVENOUS

## 2022-07-24 MED ORDER — SUCCINYLCHOLINE CHLORIDE 200 MG/10ML IV SOSY
PREFILLED_SYRINGE | INTRAVENOUS | Status: AC
  Filled 2022-07-24: qty 10

## 2022-07-24 MED ORDER — BUPIVACAINE-EPINEPHRINE (PF) 0.25% -1:200000 IJ SOLN
INTRAMUSCULAR | Status: AC
  Filled 2022-07-24: qty 30

## 2022-07-24 MED ORDER — PROPOFOL 10 MG/ML IV BOLUS
INTRAVENOUS | Status: AC
  Filled 2022-07-24: qty 20

## 2022-07-24 MED ORDER — LACTATED RINGERS IV SOLN: INTRAVENOUS | Status: DC

## 2022-07-24 MED ORDER — ALBUTEROL SULFATE HFA 108 (90 BASE) MCG/ACT IN AERS
INHALATION_SPRAY | RESPIRATORY_TRACT | Status: AC
  Filled 2022-07-24: qty 6.7

## 2022-07-24 MED ORDER — 0.9 % SODIUM CHLORIDE (POUR BTL) OPTIME
TOPICAL | Status: DC | PRN
  Administered 2022-07-24: 1000 mL

## 2022-07-24 MED ORDER — CLINDAMYCIN PHOSPHATE 900 MG/50ML IV SOLN
900.0000 mg | Freq: Once | INTRAVENOUS | Status: AC
  Administered 2022-07-24: 900 mg via INTRAVENOUS
  Filled 2022-07-24 (×2): qty 50

## 2022-07-24 MED ORDER — DEXMEDETOMIDINE HCL IN NACL 80 MCG/20ML IV SOLN
INTRAVENOUS | Status: DC | PRN
  Administered 2022-07-24: 4 ug via BUCCAL
  Administered 2022-07-24: 8 ug via BUCCAL

## 2022-07-24 MED ORDER — CHLORHEXIDINE GLUCONATE 0.12 % MT SOLN
15.0000 mL | Freq: Once | OROMUCOSAL | Status: AC
  Administered 2022-07-24: 15 mL via OROMUCOSAL

## 2022-07-24 MED ORDER — ROCURONIUM BROMIDE 10 MG/ML (PF) SYRINGE
PREFILLED_SYRINGE | INTRAVENOUS | Status: DC | PRN
  Administered 2022-07-24: 50 mg via INTRAVENOUS
  Administered 2022-07-24: 10 mg via INTRAVENOUS

## 2022-07-24 MED ORDER — FENTANYL CITRATE (PF) 250 MCG/5ML IJ SOLN
INTRAMUSCULAR | Status: DC | PRN
  Administered 2022-07-24: 100 ug via INTRAVENOUS
  Administered 2022-07-24 (×2): 50 ug via INTRAVENOUS

## 2022-07-24 MED ORDER — BUPIVACAINE-EPINEPHRINE 0.25% -1:200000 IJ SOLN
INTRAMUSCULAR | Status: DC | PRN
  Administered 2022-07-24: 30 mL

## 2022-07-24 MED ORDER — ONDANSETRON HCL 4 MG/2ML IJ SOLN
INTRAMUSCULAR | Status: AC
  Filled 2022-07-24: qty 2

## 2022-07-24 MED ORDER — OXYCODONE HCL 5 MG/5ML PO SOLN: 5.0000 mg | Freq: Once | ORAL | Status: DC | PRN

## 2022-07-24 MED ORDER — HYDROMORPHONE HCL 1 MG/ML IJ SOLN: 0.2500 mg | INTRAMUSCULAR | Status: DC | PRN

## 2022-07-24 MED ORDER — ONDANSETRON HCL 4 MG/2ML IJ SOLN: 4.0000 mg | Freq: Once | INTRAMUSCULAR | Status: DC | PRN

## 2022-07-24 MED ORDER — ORAL CARE MOUTH RINSE: 15.0000 mL | Freq: Once | OROMUCOSAL | Status: AC

## 2022-07-24 MED ORDER — PHENYLEPHRINE 80 MCG/ML (10ML) SYRINGE FOR IV PUSH (FOR BLOOD PRESSURE SUPPORT)
PREFILLED_SYRINGE | INTRAVENOUS | Status: DC | PRN
  Administered 2022-07-24: 160 ug via INTRAVENOUS
  Administered 2022-07-24 (×2): 80 ug via INTRAVENOUS
  Administered 2022-07-24: 160 ug via INTRAVENOUS

## 2022-07-24 MED ORDER — ONDANSETRON HCL 4 MG/2ML IJ SOLN
INTRAMUSCULAR | Status: AC
  Filled 2022-07-24: qty 4

## 2022-07-24 MED ORDER — ROCURONIUM BROMIDE 10 MG/ML (PF) SYRINGE
PREFILLED_SYRINGE | INTRAVENOUS | Status: AC
  Filled 2022-07-24: qty 20

## 2022-07-24 MED ORDER — ESMOLOL HCL 100 MG/10ML IV SOLN
INTRAVENOUS | Status: AC
  Filled 2022-07-24: qty 10

## 2022-07-24 MED ORDER — DEXAMETHASONE SODIUM PHOSPHATE 10 MG/ML IJ SOLN
INTRAMUSCULAR | Status: AC
  Filled 2022-07-24: qty 3

## 2022-07-24 MED ORDER — LIDOCAINE 2% (20 MG/ML) 5 ML SYRINGE
INTRAMUSCULAR | Status: AC
  Filled 2022-07-24: qty 5

## 2022-07-24 MED ORDER — ESMOLOL HCL 100 MG/10ML IV SOLN
INTRAVENOUS | Status: DC | PRN
  Administered 2022-07-24: 40 mg via INTRAVENOUS

## 2022-07-24 MED ORDER — PHENYLEPHRINE 80 MCG/ML (10ML) SYRINGE FOR IV PUSH (FOR BLOOD PRESSURE SUPPORT)
PREFILLED_SYRINGE | INTRAVENOUS | Status: AC
  Filled 2022-07-24: qty 30

## 2022-07-24 MED ORDER — OXYCODONE HCL 5 MG PO TABS: 5.0000 mg | ORAL_TABLET | Freq: Once | ORAL | Status: DC | PRN

## 2022-07-24 MED ORDER — KETAMINE HCL 10 MG/ML IJ SOLN
INTRAMUSCULAR | Status: DC | PRN
  Administered 2022-07-24: 30 mg via INTRAVENOUS
  Administered 2022-07-24: 10 mg via INTRAVENOUS

## 2022-07-24 MED ORDER — SUCCINYLCHOLINE CHLORIDE 200 MG/10ML IV SOSY
PREFILLED_SYRINGE | INTRAVENOUS | Status: DC | PRN
  Administered 2022-07-24: 180 mg via INTRAVENOUS

## 2022-07-24 MED ORDER — ALBUTEROL SULFATE HFA 108 (90 BASE) MCG/ACT IN AERS
INHALATION_SPRAY | RESPIRATORY_TRACT | Status: DC | PRN
  Administered 2022-07-24 (×2): 6 via RESPIRATORY_TRACT

## 2022-07-24 MED ORDER — OXYCODONE HCL 5 MG PO TABS: 5.0000 mg | ORAL_TABLET | ORAL | Status: DC | PRN

## 2022-07-24 MED ORDER — DEXAMETHASONE SODIUM PHOSPHATE 10 MG/ML IJ SOLN
INTRAMUSCULAR | Status: DC | PRN
  Administered 2022-07-24: 10 mg via INTRAVENOUS

## 2022-07-24 MED ORDER — ONDANSETRON HCL 4 MG/2ML IJ SOLN
INTRAMUSCULAR | Status: DC | PRN
  Administered 2022-07-24: 4 mg via INTRAVENOUS

## 2022-07-24 MED ORDER — MIDAZOLAM HCL 2 MG/2ML IJ SOLN
INTRAMUSCULAR | Status: AC
  Filled 2022-07-24: qty 2

## 2022-07-24 MED ORDER — PROPOFOL 10 MG/ML IV BOLUS
INTRAVENOUS | Status: DC | PRN
  Administered 2022-07-24: 300 mg via INTRAVENOUS
  Administered 2022-07-24: 50 mg via INTRAVENOUS

## 2022-07-24 MED ORDER — KETOROLAC TROMETHAMINE 30 MG/ML IJ SOLN: 30.0000 mg | Freq: Once | INTRAMUSCULAR | Status: DC | PRN

## 2022-07-24 MED ORDER — HYDROMORPHONE HCL 1 MG/ML IJ SOLN
1.0000 mg | INTRAMUSCULAR | Status: DC | PRN
  Administered 2022-07-24 – 2022-07-25 (×3): 1 mg via INTRAVENOUS
  Filled 2022-07-24 (×3): qty 1

## 2022-07-24 MED ORDER — FENTANYL CITRATE (PF) 250 MCG/5ML IJ SOLN
INTRAMUSCULAR | Status: AC
  Filled 2022-07-24: qty 5

## 2022-07-24 MED ORDER — MIDAZOLAM HCL 5 MG/5ML IJ SOLN
INTRAMUSCULAR | Status: DC | PRN
  Administered 2022-07-24: 2 mg via INTRAVENOUS

## 2022-07-24 MED ORDER — KETAMINE HCL 50 MG/5ML IJ SOSY
PREFILLED_SYRINGE | INTRAMUSCULAR | Status: AC
  Filled 2022-07-24: qty 5

## 2022-07-24 SURGICAL SUPPLY — 45 items
ADH SKN CLS APL DERMABOND .7 (GAUZE/BANDAGES/DRESSINGS) ×1
APL PRP STRL LF DISP 70% ISPRP (MISCELLANEOUS) ×1
BAG COUNTER SPONGE SURGICOUNT (BAG) ×1 IMPLANT
BAG SPNG CNTER NS LX DISP (BAG)
BINDER ABDOMINAL 12 XL 75-84 (SOFTGOODS) IMPLANT
BLADE CLIPPER SURG (BLADE) IMPLANT
CANISTER SUCT 3000ML PPV (MISCELLANEOUS) ×1 IMPLANT
CHLORAPREP W/TINT 26 (MISCELLANEOUS) ×1 IMPLANT
COVER SURGICAL LIGHT HANDLE (MISCELLANEOUS) ×1 IMPLANT
DERMABOND ADVANCED .7 DNX12 (GAUZE/BANDAGES/DRESSINGS) ×1 IMPLANT
DRAPE LAPAROSCOPIC ABDOMINAL (DRAPES) ×1 IMPLANT
ELECT CAUTERY BLADE 6.4 (BLADE) IMPLANT
ELECT REM PT RETURN 9FT ADLT (ELECTROSURGICAL) ×1
ELECTRODE REM PT RTRN 9FT ADLT (ELECTROSURGICAL) ×1 IMPLANT
GLOVE BIO SURGEON STRL SZ8 (GLOVE) ×1 IMPLANT
GLOVE BIOGEL PI IND STRL 8 (GLOVE) ×1 IMPLANT
GOWN STRL REUS W/ TWL LRG LVL3 (GOWN DISPOSABLE) ×3 IMPLANT
GOWN STRL REUS W/ TWL XL LVL3 (GOWN DISPOSABLE) ×1 IMPLANT
GOWN STRL REUS W/TWL LRG LVL3 (GOWN DISPOSABLE) ×3
GOWN STRL REUS W/TWL XL LVL3 (GOWN DISPOSABLE) ×1
KIT BASIN OR (CUSTOM PROCEDURE TRAY) ×1 IMPLANT
KIT TURNOVER KIT B (KITS) ×1 IMPLANT
MESH VENTRALEX ST 8CM LRG (Mesh General) IMPLANT
NDL HYPO 25GX1X1/2 BEV (NEEDLE) ×1 IMPLANT
NEEDLE HYPO 25GX1X1/2 BEV (NEEDLE) ×1 IMPLANT
NS IRRIG 1000ML POUR BTL (IV SOLUTION) ×1 IMPLANT
PACK GENERAL/GYN (CUSTOM PROCEDURE TRAY) ×1 IMPLANT
PAD ARMBOARD 7.5X6 YLW CONV (MISCELLANEOUS) ×1 IMPLANT
PENCIL SMOKE EVACUATOR (MISCELLANEOUS) ×1 IMPLANT
STAPLER VISISTAT 35W (STAPLE) IMPLANT
SUT MNCRL AB 4-0 PS2 18 (SUTURE) ×1 IMPLANT
SUT NOVA 0 T19/GS 22DT (SUTURE) IMPLANT
SUT NOVA NAB DX-16 0-1 5-0 T12 (SUTURE) IMPLANT
SUT NOVA NAB GS-21 0 18 T12 DT (SUTURE) IMPLANT
SUT PDS AB 1 TP1 96 (SUTURE) IMPLANT
SUT PROLENE 1 CT (SUTURE) IMPLANT
SUT VIC AB 2-0 SH 18 (SUTURE) IMPLANT
SUT VIC AB 3-0 54X BRD REEL (SUTURE) IMPLANT
SUT VIC AB 3-0 BRD 54 (SUTURE)
SUT VIC AB 3-0 SH 27 (SUTURE) ×1
SUT VIC AB 3-0 SH 27XBRD (SUTURE) ×1 IMPLANT
SYR CONTROL 10ML LL (SYRINGE) ×1 IMPLANT
TOWEL GREEN STERILE (TOWEL DISPOSABLE) ×1 IMPLANT
TOWEL GREEN STERILE FF (TOWEL DISPOSABLE) ×1 IMPLANT
TRAY FOLEY MTR SLVR 14FR STAT (SET/KITS/TRAYS/PACK) IMPLANT

## 2022-07-24 NOTE — Anesthesia Postprocedure Evaluation (Signed)
Anesthesia Post Note  Patient: Brian Mclean  Procedure(s) Performed: OPEN HERNIA REPAIR VENTRAL ADULT WITH MESH (Abdomen)     Patient location during evaluation: PACU Anesthesia Type: General Level of consciousness: awake and alert Pain management: pain level controlled Vital Signs Assessment: post-procedure vital signs reviewed and stable Respiratory status: spontaneous breathing, nonlabored ventilation, respiratory function stable and patient connected to nasal cannula oxygen Cardiovascular status: blood pressure returned to baseline and stable Postop Assessment: no apparent nausea or vomiting Anesthetic complications: no  No notable events documented.  Last Vitals:  Vitals:   07/24/22 1615 07/24/22 1630  BP: (!) 145/81 126/87  Pulse: 85 84  Resp: 14 20  Temp:  36.6 C  SpO2: 97% 97%    Last Pain:  Vitals:   07/24/22 1630  TempSrc:   PainSc: Asleep                 [name] S

## 2022-07-24 NOTE — Discharge Instructions (Signed)
[facility] _______[facility]  UMBILICAL OR INGUINAL HERNIA REPAIR: POST OP INSTRUCTIONS  Always review your discharge instruction sheet given to you by the facility where your surgery was performed. IF YOU HAVE DISABILITY OR FAMILY LEAVE FORMS, YOU MUST BRING THEM TO THE OFFICE FOR PROCESSING.   DO NOT GIVE THEM TO YOUR DOCTOR.  1. A  prescription for pain medication may be given to you upon discharge.  Take your pain medication as prescribed, if needed.  If narcotic pain medicine is not needed, then you may take acetaminophen (Tylenol) or ibuprofen (Advil) as needed. 2. Take your usually prescribed medications unless otherwise directed. If you need a refill on your pain medication, please contact your pharmacy.  They will contact our office to request authorization. Prescriptions will not be filled after 5 pm or on week-ends. 3. You should follow a light diet the first 24 hours after arrival home, such as soup and crackers, etc.  Be sure to include lots of fluids daily.  Resume your normal diet the day after surgery. 4.Most patients will experience some swelling and bruising around the umbilicus or in the groin and scrotum.  Ice packs and reclining will help.  Swelling and bruising can take several days to resolve.  6. It is common to experience some constipation if taking pain medication after surgery.  Increasing fluid intake and taking a stool softener (such as Colace) will usually help or prevent this problem from occurring.  A mild laxative (Milk of Magnesia or Miralax) should be taken according to package directions if there are no bowel movements after 48 hours. 7. Unless discharge instructions indicate otherwise, you may remove your bandages 24-48 hours after surgery, and you may shower at that time.  You may have steri-strips (small skin tapes) in place directly over the incision.  These strips should be left on the skin for 7-10 days.  If your surgeon used skin glue on the  incision, you may shower in 24 hours.  The glue will flake off over the next 2-3 weeks.  Any sutures or staples will be removed at the office during your follow-up visit. 8. ACTIVITIES:  You may resume regular (light) daily activities beginning the next day--such as daily self-care, walking, climbing stairs--gradually increasing activities as tolerated.  You may have sexual intercourse when it is comfortable.  Refrain from any heavy lifting or straining until approved by your doctor.  a.You may drive when you are no longer taking prescription pain medication, you can comfortably wear a seatbelt, and you can safely maneuver your car and apply brakes. b.RETURN TO WORK:   _____________________________________________  9.You should see your doctor in the office for a follow-up appointment approximately 2-3 weeks after your surgery.  Make sure that you call for this appointment within a day or two after you arrive home to insure a convenient appointment time. 10.OTHER INSTRUCTIONS: _________________________    _____________________________________  WHEN TO CALL YOUR DOCTOR: Fever over 101.0 Inability to urinate Nausea and/or vomiting Extreme swelling or bruising Continued bleeding from incision. Increased pain, redness, or drainage from the incision  The clinic staff is available to answer your questions during regular business hours.  Please don't hesitate to call and ask to speak to one of the nurses for clinical concerns.  If you have a medical emergency, go to the nearest emergency room or call 911.  A surgeon from [facility] is always on call at the hospital   [address], [address],   [place], [place]  [address] ?  [address] [address], [place], [place]   [address] [phone] ? [phone] ? FAX [phone] Web site: [email or web address]  

## 2022-07-24 NOTE — Interval H&P Note (Signed)
History and Physical Interval Note:  07/24/2022 1:42 PM  Brian Mclean  has presented today for surgery, with the diagnosis of INCARCERATED FAT CONTAINING VENTRAL HERNIA.  The various methods of treatment have been discussed with the patient and family. After consideration of risks, benefits and other options for treatment, the patient has consented to  Procedure(s): OPEN HERNIA REPAIR VENTRAL ADULT WIT POSSIBLE MESH (N/A) as a surgical intervention.  The patient's history has been reviewed, patient examined, no change in status, stable for surgery.  I have reviewed the patient's chart and labs.  Questions were answered to the patient's satisfaction.    The risk of hernia repair include bleeding,  Infection,   Recurrence of the hernia,  Mesh use, chronic pain,  Organ injury,  Bowel injury,  Bladder injury,   nerve injury with numbness around the incision,  Death,  and worsening of preexisting  medical problems.  The alternatives to surgery have been discussed as well..  Long term expectations of both operative and non operative treatments have been discussed.   The patient agrees to proceed.  [name]

## 2022-07-24 NOTE — Transfer of Care (Signed)
Immediate Anesthesia Transfer of Care Note  Patient: Brian Mclean  Procedure(s) Performed: OPEN HERNIA REPAIR VENTRAL ADULT WITH MESH (Abdomen)  Patient Location: PACU  Anesthesia Type:General  Level of Consciousness: awake, alert , and oriented  Airway & Oxygen Therapy: Patient Spontanous Breathing and Patient connected to face mask oxygen  Post-op Assessment: Report given to RN and Post -op Vital signs reviewed and stable  Post vital signs: Reviewed and stable  Last Vitals:  Vitals Value Taken Time  BP 138/80 07/24/22 1545  Temp    Pulse 93 07/24/22 1548  Resp 16 07/24/22 1548  SpO2 91 % 07/24/22 1548  Vitals shown include unvalidated device data.  Last Pain:  Vitals:   07/24/22 1320  TempSrc:   PainSc: 0-No pain         Complications: No notable events documented.

## 2022-07-24 NOTE — Anesthesia Procedure Notes (Signed)
Procedure Name: Intubation Date/Time: 07/24/2022 2:14 PM  Performed by: Colin Benton, CRNAPre-anesthesia Checklist: Patient identified, Emergency Drugs available, Suction available and Patient being monitored Patient Re-evaluated:Patient Re-evaluated prior to induction Oxygen Delivery Method: Circle system utilized Preoxygenation: Pre-oxygenation with 100% oxygen Induction Type: IV induction Ventilation: Mask ventilation without difficulty Laryngoscope Size: Mac and 4 Grade View: Grade II Tube type: Oral Tube size: 7.5 mm Number of attempts: 2 Airway Equipment and Method: Stylet Placement Confirmation: ETT inserted through vocal cords under direct vision, positive ETCO2 and breath sounds checked- equal and bilateral Secured at: 23 cm Tube secured with: Tape Dental Injury: Teeth and Oropharynx as per pre-operative assessment  Comments: DL x 1 by SRNA. Esophageal intubation.  DL x 2 by Dr. Kalman Shan.  Grade 2 view.  EBBS and VSS.

## 2022-07-24 NOTE — Op Note (Signed)
Preoperative diagnosis: Incarcerated ventral hernia without strangulation 4 cm x 4 cm  Postoperative diagnosis: Same  Procedure: Repair of a 4 cm x 4 cm incarcerated ventral hernia with Ventralight mesh measuring 8 cm circular  Surgeon: Erroll Luna, MD  Anesthesia: General with 0.25% Marcaine with epinephrine  EBL: Minimal  Specimen preperitoneal fat to pathology  Drains: None  Indications for procedure: The patient is a 37 year old male who presented yesterday with incarcerated ventral hernia.  CT scanning showed what appeared to be either omentum or preperitoneal fat.  He presents today for repair due to pain in the abdomen does not resolve with oral medications.The risk of hernia repair include bleeding,  Infection,   Recurrence of the hernia,  Mesh use, chronic pain,  Organ injury,  Bowel injury,  Bladder injury,   nerve injury with numbness around the incision,  Death,  and worsening of preexisting  medical problems.  The alternatives to surgery have been discussed as well..  Long term expectations of both operative and non operative treatments have been discussed.   The patient agrees to proceed.    Description of procedure: The patient was met in the holding area and questions were answered.  The procedure was reviewed.  He was then taken back to the operating room and placed supine upon the OR table.  After induction of general esthesia, the abdomen was prepped and draped in sterile fashion and timeout performed.  The hernia is located approximately 3 cm above the umbilicus.  There is some inflammatory changes of the skin.  A midline incision was used over the hernia which measured about 4 cm.  This was extending from just above the umbilicus toward the xiphoid.  This was dissected down and a large hernia sac was identified with preperitoneal fat incarcerated within this.  I enlarged the defect about a centimeter to reduce the preperitoneal fat back into the abdominal cavity.  Some of  this was removed.  This appeared viable and was not infarcted.  The defect measured 4 cm x 4 cm.  I placed an 8 cm circular Ventralight mesh in the preperitoneal space/retrorectus position.  I secured the edges with #1 Novafil pop-off sutures.  Irrigation was used.  There is no undue tension.  I then closed the fascia over this with #1 Novafil pop-off sutures.  The subcutaneous tissues were closed with 3-0 Vicryl.  4 Monocryl used to close the skin in a subcuticular fashion.  Dermabond applied.  Abdominal binder placed.  All counts found to be correct.  The patient was awoke extubated taken to recovery in satisfactory condition.

## 2022-07-25 ENCOUNTER — Encounter (HOSPITAL_COMMUNITY): Payer: Self-pay | Admitting: Surgery

## 2022-07-25 MED ORDER — POLYETHYLENE GLYCOL 3350 17 G PO PACK: 17.0000 g | PACK | Freq: Every day | ORAL | Status: DC | PRN

## 2022-07-25 MED ORDER — POLYETHYLENE GLYCOL 3350 17 G PO PACK: 17.0000 g | PACK | Freq: Every day | ORAL | 0 refills | Status: DC | PRN

## 2022-07-25 MED ORDER — NICOTINE 14 MG/24HR TD PT24: 14.0000 mg | MEDICATED_PATCH | Freq: Every day | TRANSDERMAL | 0 refills | Status: DC

## 2022-07-25 MED ORDER — ACETAMINOPHEN 500 MG PO TABS
1000.0000 mg | ORAL_TABLET | Freq: Four times a day (QID) | ORAL | Status: DC
  Administered 2022-07-25: 1000 mg via ORAL
  Filled 2022-07-25: qty 2

## 2022-07-25 MED ORDER — OXYCODONE HCL 5 MG PO TABS: 5.0000 mg | ORAL_TABLET | Freq: Four times a day (QID) | ORAL | 0 refills | Status: DC | PRN

## 2022-07-25 MED ORDER — DOCUSATE SODIUM 100 MG PO CAPS
100.0000 mg | ORAL_CAPSULE | Freq: Two times a day (BID) | ORAL | Status: DC
  Administered 2022-07-25: 100 mg via ORAL
  Filled 2022-07-25: qty 1

## 2022-07-25 MED ORDER — OXYCODONE HCL 5 MG PO TABS
5.0000 mg | ORAL_TABLET | ORAL | Status: DC | PRN
  Administered 2022-07-25: 10 mg via ORAL
  Filled 2022-07-25: qty 2

## 2022-07-25 MED ORDER — HYDROMORPHONE HCL 1 MG/ML IJ SOLN: 1.0000 mg | INTRAMUSCULAR | Status: DC | PRN

## 2022-07-25 NOTE — TOC Transition Note (Signed)
Transition of Care [facility]) - CM/SW Discharge Note   Patient Details  Name: Brian Mclean MRN: [id number] Date of Birth: 1986/01/05  Transition of Care [facility]) CM/SW Contact:  Pollie Friar, RN Phone Number: 07/25/2022, 9:51 AM   Clinical Narrative:    Pt is discharging home with self care. No needs per TOC.   Final next level of care: Home/Self Care Barriers to Discharge: No Barriers Identified   Patient Goals and CMS Choice      Discharge Placement                         Discharge Plan and Services Additional resources added to the After Visit Summary for                                       Social Determinants of Health (SDOH) Interventions SDOH Screenings   Tobacco Use: High Risk (07/25/2022)     Readmission Risk Interventions     No data to display

## 2022-07-25 NOTE — Discharge Summary (Signed)
[facility] Discharge Summary   Patient ID: Brian Mclean MRN: [id number] DOB/AGE: Sep 21, 1985 37 y.o.  Admit date: 07/23/2022 Discharge date: 07/25/2022  Admitting Diagnosis: Incarcerated ventral hernia  Discharge Diagnosis Patient Active Problem List   Diagnosis Date Noted   Ventral hernia [id number]   Umbilical hernia [id number]    Consultants None  Imaging: CT ABDOMEN PELVIS W CONTRAST  Result Date: 07/23/2022 CLINICAL DATA:  Abdominal pain EXAM: CT ABDOMEN AND PELVIS WITH CONTRAST TECHNIQUE: Multidetector CT imaging of the abdomen and pelvis was performed using the standard protocol following bolus administration of intravenous contrast. RADIATION DOSE REDUCTION: This exam was performed according to the departmental dose-optimization program which includes automated exposure control, adjustment of the mA and/or kV according to patient size and/or use of iterative reconstruction technique. CONTRAST:  19mL OMNIPAQUE IOHEXOL 350 MG/ML SOLN COMPARISON:  None Available. FINDINGS: Lower chest: No acute pleural or parenchymal lung disease. Hepatobiliary: Mild diffuse hepatic steatosis. No focal liver abnormality. Gallbladder is unremarkable. No biliary duct dilation. Pancreas: Unremarkable. No pancreatic ductal dilatation or surrounding inflammatory changes. Spleen: Normal in size without focal abnormality. Adrenals/Urinary Tract: Adrenal glands are unremarkable. Kidneys are normal, without renal calculi, focal lesion, or hydronephrosis. Bladder is unremarkable. Stomach/Bowel: No bowel obstruction or ileus. Normal appendix right lower quadrant. No bowel wall thickening. Vascular/Lymphatic: No significant vascular findings are present. No enlarged abdominal or pelvic lymph nodes. Reproductive: Prostate is unremarkable. Other: There is a midline supraumbilical fat containing ventral hernia, with abdominal wall defect measuring 2.2 x 2.7 cm. Fluid and edema within the herniated fat  concerning for incarcerated hernia. There is a second small fat containing umbilical hernia, without evidence of incarceration. No bowel herniation. No free intraperitoneal fluid or free gas. Musculoskeletal: No acute or destructive bony lesions. Reconstructed images demonstrate no additional findings. IMPRESSION: 1. Incarcerated fat containing midline supraumbilical ventral hernia. 2. Small uncomplicated fat containing umbilical hernia. 3. Mild hepatic steatosis. Electronically Signed   By: Randa Ngo M.D.   On: 07/23/2022 14:52    Procedures Dr. Brantley Stage (07/24/2022) - Repair of a 4 cm x 4 cm incarcerated ventral hernia with Ventralight mesh measuring 8 cm circular   Hospital Course:  Brian Mclean is a 37 y.o. male who presented to the ED 4/1 with worsening pain at site of known ventral hernia.  Workup showed  Incarcerated fat containing ventral hernia.  Patient was admitted and underwent procedure listed above.  Tolerated procedure well and was transferred to the floor.  Diet was advanced as tolerated.  On POD1, the patient was voiding well, tolerating diet, ambulating well, pain well controlled, vital signs stable, incisions c/d/i and felt stable for discharge home.  Patient will follow up as below and knows to call with questions or concerns.    I have personally reviewed the patients medication history on the [place] controlled substance database.    Physical Exam: General:  Alert, NAD, pleasant, comfortable Pulm: rate and effort normal Abd:  Soft, ND, appropriately tender, incisions cdi with dermabond present/ mild surrounding erythema (improved from preop)  Allergies as of 07/25/2022       Reactions   Penicillins Swelling   Has patient had a PCN reaction causing immediate rash, facial/tongue/throat swelling, SOB or lightheadedness with hypotension: Unknown Has patient had a PCN reaction causing severe rash involving mucus membranes or skin necrosis: Unknown Has patient had a PCN reaction  that required hospitalization: Unknown Has patient had a PCN reaction occurring within the last 10 years: No If all  of the above answers are "NO", then may proceed with Cephalosporin use.        Medication List     TAKE these medications    acetaminophen 500 MG tablet Commonly known as: TYLENOL Take 1,000 mg by mouth 2 (two) times daily as needed for moderate pain.   nicotine 14 mg/24hr patch Commonly known as: NICODERM CQ - dosed in mg/24 hours Place 1 patch (14 mg total) onto the skin daily. Start taking on: July 26, 2022   oxyCODONE 5 MG immediate release tablet Commonly known as: Oxy IR/ROXICODONE Take 1 tablet (5 mg total) by mouth every 6 (six) hours as needed for severe pain.   polyethylene glycol 17 g packet Commonly known as: MIRALAX / GLYCOLAX Take 17 g by mouth daily as needed for mild constipation.          Follow-up Information     Maczis, Carlena Hurl, [place]. Call.   Specialty: General Surgery Why: We are working on your appointment,call to confirm, Arrive 51min early to check in, fill out paperwork, Engineer, civil (consulting) ID and insurance information Contact information: [place] [address] [phone]                  Signed: Wellington Hampshire, [facility] 07/25/2022, 8:54 AM Please see Amion for pager number during day hours 7:00am-4:30pm

## 2022-07-25 NOTE — Progress Notes (Signed)
Discharge instructions reviewed with pt and family.  Copy of instructions given to pt. Pt informed his scripts were sent to his pharmacy for pick up. Questions answered.  Pt to be d/c'd via wheelchair with belongings, with family.         To be escorted by hospital volunteer.

## 2022-07-26 LAB — SURGICAL PATHOLOGY

## 2022-08-14 DIAGNOSIS — Z8719 Personal history of other diseases of the digestive system: Secondary | ICD-10-CM | POA: Diagnosis not present

## 2022-08-14 DIAGNOSIS — K439 Ventral hernia without obstruction or gangrene: Secondary | ICD-10-CM | POA: Diagnosis not present

## 2022-12-15 LAB — AMB RESULTS CONSOLE CBG: Glucose: 128

## 2023-02-01 NOTE — Progress Notes (Signed)
The patient attended 12/15/22 screening event where his BP screening results were 131/84. At the event the patient noted he smokes cigars. Pt noted food and housing sdoh insecurities. Patient was given pcp and SDOH Resources at the event. Per chart review pt had an office visit for surgery on 08/14/22. Chart review did not indicate any future appts.  Spoke with pt and was able to gain consent for a [facility] referral. CG was also able to schedule a Primary Care visit for the pt (03/07/23). CG expressed the importance of maintaining a healthy BP and having primary care. CG will do a [facility] referral for sdoh insecurities. The housing referral has been accepted in [facility] and a food referral has been created.  A sixty day follow up will be done to see if pt made it to upcoming appt. Also to see if SDOH insecurities have been resolved.

## 2023-03-07 ENCOUNTER — Encounter: Payer: Self-pay | Admitting: Family Medicine

## 2023-03-07 ENCOUNTER — Ambulatory Visit: Payer: BC Managed Care – PPO | Admitting: Family Medicine

## 2023-03-07 VITALS — BP 140/70 | HR 96 | Temp 98.1°F | Ht 70.4 in | Wt 345.6 lb

## 2023-03-07 DIAGNOSIS — Z2821 Immunization not carried out because of patient refusal: Secondary | ICD-10-CM

## 2023-03-07 DIAGNOSIS — Z7689 Persons encountering health services in other specified circumstances: Secondary | ICD-10-CM

## 2023-03-07 DIAGNOSIS — R051 Acute cough: Secondary | ICD-10-CM

## 2023-03-07 DIAGNOSIS — R9431 Abnormal electrocardiogram [ECG] [EKG]: Secondary | ICD-10-CM | POA: Diagnosis not present

## 2023-03-07 DIAGNOSIS — Z1159 Encounter for screening for other viral diseases: Secondary | ICD-10-CM

## 2023-03-07 DIAGNOSIS — Z83438 Family history of other disorder of lipoprotein metabolism and other lipidemia: Secondary | ICD-10-CM

## 2023-03-07 DIAGNOSIS — I10 Essential (primary) hypertension: Secondary | ICD-10-CM

## 2023-03-07 DIAGNOSIS — E66813 Obesity, class 3: Secondary | ICD-10-CM

## 2023-03-07 DIAGNOSIS — Z0000 Encounter for general adult medical examination without abnormal findings: Secondary | ICD-10-CM | POA: Diagnosis not present

## 2023-03-07 DIAGNOSIS — Z6841 Body Mass Index (BMI) 40.0 and over, adult: Secondary | ICD-10-CM

## 2023-03-07 MED ORDER — HYDROCHLOROTHIAZIDE 12.5 MG PO TABS
12.5000 mg | ORAL_TABLET | Freq: Every day | ORAL | 1 refills | Status: DC
Start: 2023-03-07 — End: 2023-03-19

## 2023-03-07 MED ORDER — BENZONATATE 100 MG PO CAPS
100.0000 mg | ORAL_CAPSULE | Freq: Three times a day (TID) | ORAL | 1 refills | Status: DC | PRN
Start: 2023-03-07 — End: 2023-06-12

## 2023-03-07 MED ORDER — WEGOVY 0.25 MG/0.5ML ~~LOC~~ SOAJ
0.2500 mg | SUBCUTANEOUS | 1 refills | Status: DC
Start: 2023-03-07 — End: 2023-09-04

## 2023-03-07 NOTE — Progress Notes (Signed)
I,Jameka J Llittleton, CMA,acting as a Neurosurgeon for [employer], NP.,have documented all relevant documentation on the behalf of Ellender Hose, NP,as directed by  Ellender Hose, NP while in the presence of Ellender Hose, NP.  Subjective:  Patient ID: Brian Mclean , male    DOB: September 30, 1985 , 37 y.o.   MRN: [id number]  Chief Complaint  Patient presents with   Establish Care   elevated blood pressure    HPI  Patient is a 37 year old male who presents today to establish care. He states that  he is concerned about his constant elevated BP. Patient  reports that he has a family history of hypertension, heart failure and heart disease.Patient states he has been noticing swelling of his lower extremities,headaches etc. Patient states he thinks his BP has been running high for years.        History reviewed. No pertinent past medical history.   Family History  Problem Relation Age of Onset   Hypertension Mother    Congestive Heart Failure Mother    Diabetes Father    Blindness Father    Hypertension Maternal Grandmother    Congestive Heart Failure Maternal Grandmother    Hypertension Maternal Grandfather    Congestive Heart Failure Maternal Grandfather    COPD Maternal Grandfather      Current Outpatient Medications:    acetaminophen (TYLENOL) 500 MG tablet, Take 1,000 mg by mouth 2 (two) times daily as needed for moderate pain., Disp: , Rfl:    benzonatate (TESSALON PERLES) 100 MG capsule, Take 1 capsule (100 mg total) by mouth 3 (three) times daily as needed for cough., Disp: 30 capsule, Rfl: 1   hydrochlorothiazide (HYDRODIURIL) 12.5 MG tablet, Take 1 tablet (12.5 mg total) by mouth daily., Disp: 30 tablet, Rfl: 1   Semaglutide-Weight Management (WEGOVY) 0.25 MG/0.5ML SOAJ, Inject 0.25 mg into the skin every 7 (seven) days., Disp: 2 mL, Rfl: 1   Allergies  Allergen Reactions   Penicillins Swelling    Has patient had a PCN reaction causing immediate rash, facial/tongue/throat swelling,  SOB or lightheadedness with hypotension: Unknown Has patient had a PCN reaction causing severe rash involving mucus membranes or skin necrosis: Unknown Has patient had a PCN reaction that required hospitalization: Unknown Has patient had a PCN reaction occurring within the last 10 years: No If all of the above answers are "NO", then may proceed with Cephalosporin use.      Review of Systems  Constitutional: Negative.   HENT: Negative.    Eyes: Negative.   Respiratory: Negative.    Cardiovascular:  Positive for leg swelling.  Gastrointestinal: Negative.   Endocrine: Negative.   Genitourinary: Negative.   Musculoskeletal: Negative.   Skin: Negative.   Neurological: Negative.   Hematological: Negative.   Psychiatric/Behavioral: Negative.       Today's Vitals   03/07/23 1147  BP: (!) 140/70  Pulse: 96  Temp: 98.1 F (36.7 C)  Weight: (!) 345 lb 9.6 oz (156.8 kg)  Height: 5' 10.4" (1.788 m)  PainSc: 5   PainLoc: Foot   Body mass index is 49.03 kg/m.  Wt Readings from Last 3 Encounters:  03/07/23 (!) 345 lb 9.6 oz (156.8 kg)  07/24/22 (!) 350 lb (158.8 kg)  04/02/22 (!) 340 lb (154.2 kg)    The ASCVD Risk score (Arnett DK, et al., 2019) failed to calculate for the following reasons:   The 2019 ASCVD risk score is only valid for ages 60 to 14  Objective:  Physical  Exam HENT:     Head: Normocephalic.  Cardiovascular:     Rate and Rhythm: Normal rate and regular rhythm.  Pulmonary:     Effort: Pulmonary effort is normal.     Breath sounds: Normal breath sounds.  Abdominal:     General: Bowel sounds are normal.  Musculoskeletal:     Right lower leg: Edema present.     Left lower leg: Edema present.  Skin:    General: Skin is warm and dry.  Neurological:     Mental Status: He is alert and oriented to person, place, and time.  Psychiatric:        Mood and Affect: Mood normal.        Behavior: Behavior normal.         Assessment And Plan:  Establishing care  with new doctor, encounter for  Encounter for general adult medical examination w/o abnormal findings -     CBC -     CMP14+EGFR  Influenza vaccination declined  COVID-19 vaccination declined  Encounter for hepatitis C screening test for low risk patient  Primary hypertension Assessment & Plan: Start hydrochlorothiazide 12.5 mg every day.  Orders: -     hydroCHLOROthiazide; Take 1 tablet (12.5 mg total) by mouth daily.  Dispense: 30 tablet; Refill: 1 -     EKG 12-Lead  Family history of hyperlipidemia -     Lipid panel  Acute cough -     Benzonatate; Take 1 capsule (100 mg total) by mouth 3 (three) times daily as needed for cough.  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 Advised to aim for at least 150 minutes of exercise per week. Will add Wegovy once weekly SQ injection.  Orders: -     Wegovy; Inject 0.25 mg into the skin every 7 (seven) days.  Dispense: 2 mL; Refill: 1    Return in about 2 weeks (around 03/21/2023) for 1 year physical, nurse visit, blood pressure and BMP .  Patient was given opportunity to ask questions. Patient verbalized understanding of the plan and was able to repeat key elements of the plan. All questions were answered to their satisfaction.   I, Ellender Hose, NP, have reviewed all documentation for this visit. The documentation on 03/10/2023 for the exam, diagnosis, procedures, and orders are all accurate and complete.     IF YOU HAVE BEEN REFERRED TO A SPECIALIST, IT MAY TAKE 1-2 WEEKS TO SCHEDULE/PROCESS THE REFERRAL. IF YOU HAVE NOT HEARD FROM US/SPECIALIST IN TWO WEEKS, PLEASE GIVE [place] A CALL AT  [phone].

## 2023-03-08 LAB — CMP14+EGFR
ALT: 23 [IU]/L (ref 0–44)
AST: 25 [IU]/L (ref 0–40)
Albumin: 4.2 g/dL (ref 4.1–5.1)
Alkaline Phosphatase: 89 [IU]/L (ref 44–121)
BUN/Creatinine Ratio: 13 (ref 9–20)
BUN: 14 mg/dL (ref 6–20)
Bilirubin Total: 0.3 mg/dL (ref 0.0–1.2)
CO2: 25 mmol/L (ref 20–29)
Calcium: 8.9 mg/dL (ref 8.7–10.2)
Chloride: 103 mmol/L (ref 96–106)
Creatinine, Ser: 1.05 mg/dL (ref 0.76–1.27)
Globulin, Total: 2.5 g/dL (ref 1.5–4.5)
Glucose: 98 mg/dL (ref 70–99)
Potassium: 4.7 mmol/L (ref 3.5–5.2)
Sodium: 142 mmol/L (ref 134–144)
Total Protein: 6.7 g/dL (ref 6.0–8.5)
eGFR: 94 mL/min/{1.73_m2} (ref >59)

## 2023-03-08 LAB — LIPID PANEL
Chol/HDL Ratio: 5.8 ratio — ABNORMAL HIGH (ref 0.0–5.0)
Cholesterol, Total: 257 mg/dL — ABNORMAL HIGH (ref 100–199)
HDL: 44 mg/dL (ref >39)
LDL Chol Calc (NIH): 173 mg/dL — ABNORMAL HIGH (ref 0–99)
Triglycerides: 216 mg/dL — ABNORMAL HIGH (ref 0–149)
VLDL Cholesterol Cal: 40 mg/dL (ref 5–40)

## 2023-03-08 LAB — CBC
Hematocrit: 45.2 % (ref 37.5–51.0)
Hemoglobin: 14.4 g/dL (ref 13.0–17.7)
MCH: 26 pg — ABNORMAL LOW (ref 26.6–33.0)
MCHC: 31.9 g/dL (ref 31.5–35.7)
MCV: 82 fL (ref 79–97)
Platelets: 239 10*3/uL (ref 150–450)
RBC: 5.54 x10E6/uL (ref 4.14–5.80)
RDW: 13.6 % (ref 11.6–15.4)
WBC: 6.4 10*3/uL (ref 3.4–10.8)

## 2023-03-13 DIAGNOSIS — R9431 Abnormal electrocardiogram [ECG] [EKG]: Secondary | ICD-10-CM | POA: Insufficient documentation

## 2023-03-13 DIAGNOSIS — Z83438 Family history of other disorder of lipoprotein metabolism and other lipidemia: Secondary | ICD-10-CM | POA: Insufficient documentation

## 2023-03-13 DIAGNOSIS — E66813 Obesity, class 3: Secondary | ICD-10-CM | POA: Insufficient documentation

## 2023-03-13 DIAGNOSIS — R051 Acute cough: Secondary | ICD-10-CM | POA: Insufficient documentation

## 2023-03-13 DIAGNOSIS — Z7689 Persons encountering health services in other specified circumstances: Secondary | ICD-10-CM | POA: Insufficient documentation

## 2023-03-13 DIAGNOSIS — Z0000 Encounter for general adult medical examination without abnormal findings: Secondary | ICD-10-CM | POA: Insufficient documentation

## 2023-03-13 DIAGNOSIS — Z2821 Immunization not carried out because of patient refusal: Secondary | ICD-10-CM | POA: Insufficient documentation

## 2023-03-13 DIAGNOSIS — Z1159 Encounter for screening for other viral diseases: Secondary | ICD-10-CM | POA: Insufficient documentation

## 2023-03-13 DIAGNOSIS — I10 Essential (primary) hypertension: Secondary | ICD-10-CM | POA: Insufficient documentation

## 2023-03-13 NOTE — Assessment & Plan Note (Signed)
Start hydrochlorothiazide 12. 5 mg every day

## 2023-03-13 NOTE — Assessment & Plan Note (Signed)
He has been trying diet and exercise for over 6 months.He is encouraged to strive for BMI less than 30 to decrease cardiac risk. Advised to aim for at least 150 minutes of exercise per week. Will add Wegovy once weekly SQ injection.

## 2023-03-19 ENCOUNTER — Ambulatory Visit: Payer: BC Managed Care – PPO

## 2023-03-19 ENCOUNTER — Other Ambulatory Visit: Payer: Self-pay | Admitting: Family Medicine

## 2023-03-19 ENCOUNTER — Encounter: Payer: Self-pay | Admitting: Family Medicine

## 2023-03-19 VITALS — BP 130/92 | HR 104 | Temp 98.1°F | Ht 70.4 in | Wt 345.0 lb

## 2023-03-19 DIAGNOSIS — I10 Essential (primary) hypertension: Secondary | ICD-10-CM

## 2023-03-19 MED ORDER — HYDROCHLOROTHIAZIDE 25 MG PO TABS: 25.0000 mg | ORAL_TABLET | Freq: Every day | ORAL | 3 refills | Status: DC

## 2023-03-19 NOTE — Progress Notes (Signed)
Patient presents today for a blood pressure. Patient reports he takes his hydrochlorothiazide 12.5mg  in the mornings. I checked his blood pressure and it was 130/86 P103. I had the patient wait 10 minutes and rechecked his blood pressure and it was 130/92 P104. Patient reports he is still having a little bit of swelling in his legs but it has gotten better since he was initial seen. Spoke with provider she advised for him to increase his hydrochlorothiazide 12.5mg  to 25mg  and come back in 2 weeks for a BMP. Patient was also advised to keep his appointment with the cardiologist. YL,RMA    BP Readings from Last 3 Encounters:  03/07/23 (!) 140/70  12/15/22 131/84  07/25/22 136/81

## 2023-03-26 ENCOUNTER — Encounter: Payer: Self-pay | Admitting: *Deleted

## 2023-03-26 NOTE — Progress Notes (Signed)
Pt attended 12/20/22 screening event where his b/p was 131/84 and his blood sugar was 128. At the event, the pt did not note a PCP name and did identify both food and housing SDOH insecurities for both of which he was given resources at the event. During the initial event f/u, the health equity team member documented referring the pt to the [facility] for SDOH resources and made appt for pt to establish care with a PCP. Chart review and the pt's 60 day f/u call confirmed pt did get established with NP Ellender Hose at [facility], where, per chart review, he had an initial exam and 2 b/p f/u visits and has a future appt both with his PCP on 04/03/23 and with cardiology on 05/15/23. Pt talked extensively about his own efforts to prevent heart failure like his mother's by controlling his b/p, watching what he eats, and taking the medicines he needs - he also confirmed his intention to keep all his upcoming physician appts. Pt stated he was in housing now and was waiting for a response to his application for more permanent housing now. He also said he goes to the [employer] once a week for food. Pt requested the [employer] and additional food bank and pantry resources by emailed to him at [email or web address], which was done today. No additional health equity team support indicated at this time.

## 2023-04-03 ENCOUNTER — Other Ambulatory Visit: Payer: BC Managed Care – PPO

## 2023-04-03 DIAGNOSIS — I10 Essential (primary) hypertension: Secondary | ICD-10-CM | POA: Diagnosis not present

## 2023-04-04 LAB — BASIC METABOLIC PANEL
BUN/Creatinine Ratio: 14 (ref 9–20)
BUN: 16 mg/dL (ref 6–20)
CO2: 23 mmol/L (ref 20–29)
Calcium: 9.1 mg/dL (ref 8.7–10.2)
Chloride: 100 mmol/L (ref 96–106)
Creatinine, Ser: 1.12 mg/dL (ref 0.76–1.27)
Glucose: 96 mg/dL (ref 70–99)
Potassium: 4.3 mmol/L (ref 3.5–5.2)
Sodium: 140 mmol/L (ref 134–144)
eGFR: 87 mL/min/{1.73_m2} (ref >59)

## 2023-04-08 DIAGNOSIS — J40 Bronchitis, not specified as acute or chronic: Secondary | ICD-10-CM | POA: Diagnosis not present

## 2023-04-15 ENCOUNTER — Other Ambulatory Visit: Payer: Self-pay | Admitting: Family Medicine

## 2023-04-15 DIAGNOSIS — I10 Essential (primary) hypertension: Secondary | ICD-10-CM

## 2023-04-25 DIAGNOSIS — R062 Wheezing: Secondary | ICD-10-CM | POA: Diagnosis not present

## 2023-04-25 DIAGNOSIS — J189 Pneumonia, unspecified organism: Secondary | ICD-10-CM | POA: Diagnosis not present

## 2023-05-15 ENCOUNTER — Ambulatory Visit: Payer: BC Managed Care – PPO | Admitting: Cardiology

## 2023-06-12 ENCOUNTER — Telehealth (INDEPENDENT_AMBULATORY_CARE_PROVIDER_SITE_OTHER): Payer: BC Managed Care – PPO | Admitting: Family Medicine

## 2023-06-12 ENCOUNTER — Encounter: Payer: Self-pay | Admitting: Family Medicine

## 2023-06-12 ENCOUNTER — Ambulatory Visit: Payer: Self-pay | Admitting: Family Medicine

## 2023-06-12 DIAGNOSIS — Z716 Tobacco abuse counseling: Secondary | ICD-10-CM | POA: Insufficient documentation

## 2023-06-12 DIAGNOSIS — J42 Unspecified chronic bronchitis: Secondary | ICD-10-CM | POA: Insufficient documentation

## 2023-06-12 DIAGNOSIS — I10 Essential (primary) hypertension: Secondary | ICD-10-CM

## 2023-06-12 DIAGNOSIS — E782 Mixed hyperlipidemia: Secondary | ICD-10-CM | POA: Insufficient documentation

## 2023-06-12 DIAGNOSIS — R0602 Shortness of breath: Secondary | ICD-10-CM | POA: Insufficient documentation

## 2023-06-12 MED ORDER — NICOTINE 14 MG/24HR TD PT24: 14.0000 mg | MEDICATED_PATCH | Freq: Every day | TRANSDERMAL | 2 refills | Status: DC

## 2023-06-12 MED ORDER — HYDROCHLOROTHIAZIDE 25 MG PO TABS
25.0000 mg | ORAL_TABLET | Freq: Every day | ORAL | 5 refills | Status: DC
Start: 2023-06-12 — End: 2024-02-17

## 2023-06-12 MED ORDER — BENZONATATE 100 MG PO CAPS
100.0000 mg | ORAL_CAPSULE | Freq: Three times a day (TID) | ORAL | 1 refills | Status: AC | PRN
Start: 2023-06-12 — End: 2024-06-11

## 2023-06-12 MED ORDER — AIRSUPRA 90-80 MCG/ACT IN AERO: 2.0000 | INHALATION_SPRAY | RESPIRATORY_TRACT | 3 refills | Status: DC | PRN

## 2023-06-12 NOTE — Patient Instructions (Signed)
 Hypertension, Adult Hypertension is another name for high blood pressure. High blood pressure forces your heart to work harder to pump blood. This can cause problems over time. There are two numbers in a blood pressure reading. There is a top number (systolic) over a bottom number (diastolic). It is best to have a blood pressure that is below 120/80. What are the causes? The cause of this condition is not known. Some other conditions can lead to high blood pressure. What increases the risk? Some lifestyle factors can make you more likely to develop high blood pressure: Smoking. Not getting enough exercise or physical activity. Being overweight. Having too much fat, sugar, calories, or salt (sodium) in your diet. Drinking too much alcohol. Other risk factors include: Having any of these conditions: Heart disease. Diabetes. High cholesterol. Kidney disease. Obstructive sleep apnea. Having a family history of high blood pressure and high cholesterol. Age. The risk increases with age. Stress. What are the signs or symptoms? High blood pressure may not cause symptoms. Very high blood pressure (hypertensive crisis) may cause: Headache. Fast or uneven heartbeats (palpitations). Shortness of breath. Nosebleed. Vomiting or feeling like you may vomit (nauseous). Changes in how you see. Very bad chest pain. Feeling dizzy. Seizures. How is this treated? This condition is treated by making healthy lifestyle changes, such as: Eating healthy foods. Exercising more. Drinking less alcohol. Your doctor may prescribe medicine if lifestyle changes do not help enough and if: Your top number is above 130. Your bottom number is above 80. Your personal target blood pressure may vary. Follow these instructions at home: Eating and drinking  If told, follow the DASH eating plan. To follow this plan: Fill one half of your plate at each meal with fruits and vegetables. Fill one fourth of your plate  at each meal with whole grains. Whole grains include whole-wheat pasta, brown rice, and whole-grain bread. Eat or drink low-fat dairy products, such as skim milk or low-fat yogurt. Fill one fourth of your plate at each meal with low-fat (lean) proteins. Low-fat proteins include fish, chicken without skin, eggs, beans, and tofu. Avoid fatty meat, cured and processed meat, or chicken with skin. Avoid pre-made or processed food. Limit the amount of salt in your diet to less than 1,500 mg each day. Do not drink alcohol if: Your doctor tells you not to drink. You are pregnant, may be pregnant, or are planning to become pregnant. If you drink alcohol: Limit how much you have to: 0-1 drink a day for women. 0-2 drinks a day for men. Know how much alcohol is in your drink. In the [place] one drink equals one 12 oz bottle of beer (355 mL), one 5 oz glass of wine (148 mL), or one 1 oz glass of hard liquor (44 mL). Lifestyle  Work with your doctor to stay at a healthy weight or to lose weight. Ask your doctor what the best weight is for you. Get at least 30 minutes of exercise that causes your heart to beat faster (aerobic exercise) most days of the week. This may include walking, swimming, or biking. Get at least 30 minutes of exercise that strengthens your muscles (resistance exercise) at least 3 days a week. This may include lifting weights or doing Pilates. Do not smoke or use any products that contain nicotine or tobacco. If you need help quitting, ask your doctor. Check your blood pressure at home as told by your doctor. Keep all follow-up visits. Medicines Take over-the-counter and prescription medicines  only as told by your doctor. Follow directions carefully. Do not skip doses of blood pressure medicine. The medicine does not work as well if you skip doses. Skipping doses also puts you at risk for problems. Ask your doctor about side effects or reactions to medicines that you should watch  for. Contact a doctor if: You think you are having a reaction to the medicine you are taking. You have headaches that keep coming back. You feel dizzy. You have swelling in your ankles. You have trouble with your vision. Get help right away if: You get a very bad headache. You start to feel mixed up (confused). You feel weak or numb. You feel faint. You have very bad pain in your: Chest. Belly (abdomen). You vomit more than once. You have trouble breathing. These symptoms may be an emergency. Get help right away. Call 911. Do not wait to see if the symptoms will go away. Do not drive yourself to the hospital. Summary Hypertension is another name for high blood pressure. High blood pressure forces your heart to work harder to pump blood. For most people, a normal blood pressure is less than 120/80. Making healthy choices can help lower blood pressure. If your blood pressure does not get lower with healthy choices, you may need to take medicine. This information is not intended to replace advice given to you by your health care provider. Make sure you discuss any questions you have with your health care provider. Document Revised: 01/26/2021 Document Reviewed: 01/26/2021 Elsevier Patient Education  2024 [employer].

## 2023-06-12 NOTE — Assessment & Plan Note (Signed)
 Low fat diet and exercise advised

## 2023-06-12 NOTE — Assessment & Plan Note (Signed)
 Smoking cessation advised and use inhaler as needed

## 2023-06-12 NOTE — Assessment & Plan Note (Signed)
 Use nicotine patch as directed.

## 2023-06-12 NOTE — Assessment & Plan Note (Signed)
 Low salt diet and exercise advised

## 2023-06-12 NOTE — Progress Notes (Signed)
 Virtual Visit via Video Note  I,Jameka J Llittleton, CMA,acting as a scribe for [employer], NP.,have documented all relevant documentation on the behalf of Brian Hose, NP,as directed by  Brian Hose, NP while in the presence of Brian Hose, NP.  I connected with Brian Mclean on 06/12/23 at 10:00 AM EST by a video enabled telemedicine application and verified that I am speaking with the correct person using two identifiers.  Patient Location: Home Provider Location: Home Office  I discussed the limitations, risks, security, and privacy concerns of performing an evaluation and management service by video and the availability of in person appointments. I also discussed with the patient that there may be a patient responsible charge related to this service. The patient expressed understanding and agreed to proceed.  Subjective: PCP: Brian Hose, NP  Chief Complaint  Patient presents with   Hypertension   Patient is a  37 reports he would like an increase in his hydrochlorothiazide medication he is still having swelling in his legs.   Hypertension This is a chronic problem. The current episode started more than 1 month ago. The problem has been waxing and waning since onset. Pertinent negatives include no headaches. Agents associated with hypertension include decongestants and steroids. Risk factors for coronary artery disease include male gender, obesity, dyslipidemia and smoking/tobacco exposure. Past treatments include diuretics. Compliance problems include diet.      ROS: Per HPI  Current Outpatient Medications:    Albuterol-Budesonide (AIRSUPRA) 90-80 MCG/ACT AERO, Inhale 2 puffs into the lungs every 4 (four) hours as needed., Disp: 32.1 g, Rfl: 3   benzonatate (TESSALON PERLES) 100 MG capsule, Take 1 capsule (100 mg total) by mouth 3 (three) times daily as needed for cough., Disp: 30 capsule, Rfl: 1   hydrochlorothiazide (HYDRODIURIL) 25 MG tablet, Take 1 tablet (25 mg total)  by mouth daily., Disp: 30 tablet, Rfl: 5   nicotine (NICODERM CQ) 14 mg/24hr patch, Place 1 patch (14 mg total) onto the skin daily., Disp: 28 patch, Rfl: 2   acetaminophen (TYLENOL) 500 MG tablet, Take 1,000 mg by mouth 2 (two) times daily as needed for moderate pain., Disp: , Rfl:    Semaglutide-Weight Management (WEGOVY) 0.25 MG/0.5ML SOAJ, Inject 0.25 mg into the skin every 7 (seven) days., Disp: 2 mL, Rfl: 1  Observations/Objective: Today's Vitals   Physical Exam Neurological:     Mental Status: He is alert.     Assessment and Plan: Primary hypertension Assessment & Plan: Low salt diet and exercise advised.  Orders: -     hydroCHLOROthiazide; Take 1 tablet (25 mg total) by mouth daily.  Dispense: 30 tablet; Refill: 5 -     CMP14+EGFR -     CBC with Differential/Platelet  Encounter for smoking cessation counseling Assessment & Plan: Use nicotine patch as directed.  Orders: -     Nicotine; Place 1 patch (14 mg total) onto the skin daily.  Dispense: 28 patch; Refill: 2  Hyperlipemia, mixed Assessment & Plan: Low fat diet and exercise advised  Orders: -     Lipid panel  Chronic bronchitis with wheezing (HCC) Assessment & Plan: Smoking cessation advised and use inhaler as needed  Orders: -     Airsupra; Inhale 2 puffs into the lungs every 4 (four) hours as needed.  Dispense: 32.1 g; Refill: 3 -     Benzonatate; Take 1 capsule (100 mg total) by mouth 3 (three) times daily as needed for cough.  Dispense: 30 capsule; Refill: 1  Follow Up Instructions: Return for Labs on friday.Uncontrolled BP check-3/4 months.   I discussed the assessment and treatment plan with the patient. The patient was provided an opportunity to ask questions, and all were answered. The patient agreed with the plan and demonstrated an understanding of the instructions.   The patient was advised to call back or seek an in-person evaluation if the symptoms worsen or if the condition fails to  improve as anticipated.  The above assessment and management plan was discussed with the patient. The patient verbalized understanding of and has agreed to the management plan.  I, Brian Hose, NP, have reviewed all documentation for this visit. The documentation on 06/12/2023 for the exam, diagnosis, procedures, and orders are all accurate and complete.

## 2023-06-14 ENCOUNTER — Ambulatory Visit: Payer: Self-pay

## 2023-06-14 VITALS — BP 136/84 | Temp 98.1°F | Ht 70.0 in | Wt 345.0 lb

## 2023-06-14 DIAGNOSIS — E782 Mixed hyperlipidemia: Secondary | ICD-10-CM | POA: Diagnosis not present

## 2023-06-14 DIAGNOSIS — I10 Essential (primary) hypertension: Secondary | ICD-10-CM

## 2023-06-14 NOTE — Progress Notes (Signed)
 Patient presents today for bpc. He takes hydrochlorothiazide 25MG . He reports taking in the morning. He did take medication before coming in today. Denies headache, chest pain & sob. He denies exercising when he can. He states drinking adequate amounts of water a day.  BP Readings from Last 3 Encounters:  06/14/23 136/84  03/19/23 (!) 130/92  03/07/23 (!) 140/70  Per provider patient is to continue with current regimen. He is scheduled to cone back in May. Patient aware. Appointment scheduled.

## 2023-06-15 LAB — CMP14+EGFR
ALT: 34 IU/L (ref 0–44)
AST: 24 IU/L (ref 0–40)
Albumin: 4.2 g/dL (ref 4.1–5.1)
Alkaline Phosphatase: 73 IU/L (ref 44–121)
BUN/Creatinine Ratio: 10 (ref 9–20)
BUN: 12 mg/dL (ref 6–20)
Bilirubin Total: 0.3 mg/dL (ref 0.0–1.2)
CO2: 29 mmol/L (ref 20–29)
Calcium: 9.6 mg/dL (ref 8.7–10.2)
Chloride: 99 mmol/L (ref 96–106)
Creatinine, Ser: 1.17 mg/dL (ref 0.76–1.27)
Globulin, Total: 2.8 g/dL (ref 1.5–4.5)
Glucose: 101 mg/dL — ABNORMAL HIGH (ref 70–99)
Potassium: 4.1 mmol/L (ref 3.5–5.2)
Sodium: 141 mmol/L (ref 134–144)
Total Protein: 7 g/dL (ref 6.0–8.5)
eGFR: 83 mL/min/{1.73_m2} (ref >59)

## 2023-06-15 LAB — LIPID PANEL
Chol/HDL Ratio: 6.6 ratio — ABNORMAL HIGH (ref 0.0–5.0)
Cholesterol, Total: 299 mg/dL — ABNORMAL HIGH (ref 100–199)
HDL: 45 mg/dL (ref >39)
LDL Chol Calc (NIH): 214 mg/dL — ABNORMAL HIGH (ref 0–99)
Triglycerides: 207 mg/dL — ABNORMAL HIGH (ref 0–149)
VLDL Cholesterol Cal: 40 mg/dL (ref 5–40)

## 2023-06-15 LAB — CBC WITH DIFFERENTIAL/PLATELET
Basophils Absolute: 0 10*3/uL (ref 0.0–0.2)
Basos: 1 %
EOS (ABSOLUTE): 0.2 10*3/uL (ref 0.0–0.4)
Eos: 2 %
Hematocrit: 47.3 % (ref 37.5–51.0)
Hemoglobin: 15.3 g/dL (ref 13.0–17.7)
Immature Grans (Abs): 0 10*3/uL (ref 0.0–0.1)
Immature Granulocytes: 0 %
Lymphocytes Absolute: 1.9 10*3/uL (ref 0.7–3.1)
Lymphs: 31 %
MCH: 26.1 pg — ABNORMAL LOW (ref 26.6–33.0)
MCHC: 32.3 g/dL (ref 31.5–35.7)
MCV: 81 fL (ref 79–97)
Monocytes Absolute: 0.5 10*3/uL (ref 0.1–0.9)
Monocytes: 8 %
Neutrophils Absolute: 3.6 10*3/uL (ref 1.4–7.0)
Neutrophils: 58 %
Platelets: 232 10*3/uL (ref 150–450)
RBC: 5.86 x10E6/uL — ABNORMAL HIGH (ref 4.14–5.80)
RDW: 13.9 % (ref 11.6–15.4)
WBC: 6.3 10*3/uL (ref 3.4–10.8)

## 2023-06-19 ENCOUNTER — Encounter: Payer: Self-pay | Admitting: Family Medicine

## 2023-06-19 ENCOUNTER — Other Ambulatory Visit: Payer: Self-pay | Admitting: Family Medicine

## 2023-06-19 MED ORDER — ATORVASTATIN CALCIUM 40 MG PO TABS: 40.0000 mg | ORAL_TABLET | Freq: Every day | ORAL | 6 refills | Status: DC

## 2023-06-19 NOTE — Progress Notes (Signed)
 Your cholesterol has gone higher. I have sent cholesterol medication to the pharmacy 1 tablet at night . Low fat diet and exercise STRONGLY advised.   Thanks,  Dennie Bible

## 2023-07-16 ENCOUNTER — Ambulatory Visit: Payer: BC Managed Care – PPO | Attending: Cardiology | Admitting: Cardiology

## 2023-07-16 ENCOUNTER — Encounter: Payer: Self-pay | Admitting: Cardiology

## 2023-07-16 VITALS — BP 160/90 | HR 91 | Resp 12 | Ht 70.0 in | Wt 363.8 lb

## 2023-07-16 DIAGNOSIS — R9431 Abnormal electrocardiogram [ECG] [EKG]: Secondary | ICD-10-CM | POA: Diagnosis not present

## 2023-07-16 DIAGNOSIS — F1210 Cannabis abuse, uncomplicated: Secondary | ICD-10-CM

## 2023-07-16 DIAGNOSIS — I10 Essential (primary) hypertension: Secondary | ICD-10-CM

## 2023-07-16 DIAGNOSIS — R072 Precordial pain: Secondary | ICD-10-CM | POA: Diagnosis not present

## 2023-07-16 DIAGNOSIS — F17210 Nicotine dependence, cigarettes, uncomplicated: Secondary | ICD-10-CM

## 2023-07-16 DIAGNOSIS — R0609 Other forms of dyspnea: Secondary | ICD-10-CM | POA: Diagnosis not present

## 2023-07-16 DIAGNOSIS — E7800 Pure hypercholesterolemia, unspecified: Secondary | ICD-10-CM

## 2023-07-16 MED ORDER — LOSARTAN POTASSIUM 50 MG PO TABS: 50.0000 mg | ORAL_TABLET | Freq: Every day | ORAL | 3 refills | Status: DC

## 2023-07-16 MED ORDER — LABETALOL HCL 200 MG PO TABS: 200.0000 mg | ORAL_TABLET | Freq: Two times a day (BID) | ORAL | 3 refills | Status: DC

## 2023-07-16 NOTE — Patient Instructions (Signed)
 Medication Instructions:  Your physician has recommended you make the following change in your medication:   START Labetalol 200 mg twice daily   START Losartan 50 mg once daily in the evening  **Take hydrochlorothiazide in the morning**    *If you need a refill on your cardiac medications before your next appointment, please call your pharmacy*  Lab Work: To be completed tomorrow:   - CMP - TSH -Urine to protein creatinine ratio - AM Cortisol -Aldosterone + Renin - Metanephrines, plasma  - Metanephrines, urine  If you have labs (blood work) drawn today and your tests are completely normal, you will receive your results only by: MyChart Message (if you have MyChart) OR A paper copy in the mail If you have any lab test that is abnormal or we need to change your treatment, we will call you to review the results.  Testing/Procedures: Your physician has requested that you have an echocardiogram. Echocardiography is a painless test that uses sound waves to create images of your heart. It provides your doctor with information about the size and shape of your heart and how well your heart's chambers and valves are working. This procedure takes approximately one hour. There are no restrictions for this procedure. Please do NOT wear cologne, perfume, aftershave, or lotions (deodorant is allowed). Please arrive 15 minutes prior to your appointment time.  Please note: We ask at that you not bring children with you during ultrasound (echo/ vascular) testing. Due to room size and safety concerns, children are not allowed in the ultrasound rooms during exams. Our front office staff cannot provide observation of children in our lobby area while testing is being conducted. An adult accompanying a patient to their appointment will only be allowed in the ultrasound room at the discretion of the ultrasound technician under special circumstances. We apologize for any inconvenience.   -------------------------------------------- Your physician has requested that you have a stress echocardiogram. For further information please visit [email or web address] Please follow instruction sheet as given.  Please note: We ask at that you not bring children with you during ultrasound (echo/ vascular) testing. Due to room size and safety concerns, children are not allowed in the ultrasound rooms during exams. Our front office staff cannot provide observation of children in our lobby area while testing is being conducted. An adult accompanying a patient to their appointment will only be allowed in the ultrasound room at the discretion of the ultrasound technician under special circumstances. We apologize for any inconvenience. -------------------------------------------- Your physician has requested that you have a coronary calcium score performed. This is not covered by insurance and will be an out-of-pocket cost of approximately $99.   Follow-Up: At [facility], you and your health needs are our priority.  As part of our continuing mission to provide you with exceptional heart care, we have created designated Provider Care Teams.  These Care Teams include your primary Cardiologist (physician) and Advanced Practice Providers (APPs -  Physician Assistants and Nurse Practitioners) who all work together to provide you with the care you need, when you need it.   Your next appointment:   8 week(s)  The format for your next appointment:   In Person  Provider:   Tessa Lerner, DO {

## 2023-07-16 NOTE — Progress Notes (Signed)
 Cardiology Office Note:    Date:  07/16/2023  NAME:  Brian Mclean    MRN: [id number] DOB:  Apr 22, 1986   PCP:  Ellender Hose, NP  Former Cardiology Providers: None Primary Cardiologist:  Tessa Lerner, DO, [facility] (established care 07/16/2023) Electrophysiologist:  None   Referring MD: Ellender Hose, NP  Reason of Consult: Abnormal EKG, hypertension  Chief Complaint  Patient presents with   Abnormal ECG   New Patient (Initial Visit)    History of Present Illness:    Brian Mclean is a 38 y.o. African-American male whose past medical history and cardiovascular risk factors includes: HLD, Hypertension, family history of heart failure and heart disease, obesity, cigarette smoking, marijuana use. He is being seen today for the evaluation of abnormal EKG at the request of Ellender Hose, NP.  Patient endorses that has been experiencing chest pain, shortness of breath with effort related activities, and his blood pressures are not well-controlled.  Chest pain: Symptom onset: Couple weeks ago. Location: Substernal. Intensity 3 out of 10. At times brought on by effort related activities but not always. Improves with resting and also rubbing his anterior chest. Symptoms resolved in seconds to minutes. No change in overall physical endurance. No radiation of pain  Patient also endorses shortness of breath with effort related activities.  Denies orthopnea, or PND.  He does endorse lower extremity swelling which is chronic and stable.  Benign essential hypertension: Diagnosed in 2019, per patient. Does not check his blood pressures at home. Does not consume significant amount of canned foods and goes out to eat once or twice a week Currently on hydrochlorothiazide 25 mg p.o. every morning. Has not been evaluated for sleep apnea or secondary causes of hypertension.  Cigarette smoking: 1 cigarette/day.  Otherwise has been smoking since age of 15 averaging 0.5 packs/day  Marijuana use:  2 to 3 g/day  Patient states that his mother passed away at the age of 59 and her sleep, cause of death presumed to be heart attack.  Current Medications: Current Meds  Medication Sig   acetaminophen (TYLENOL) 500 MG tablet Take 1,000 mg by mouth 2 (two) times daily as needed for moderate pain.   benzonatate (TESSALON PERLES) 100 MG capsule Take 1 capsule (100 mg total) by mouth 3 (three) times daily as needed for cough.   hydrochlorothiazide (HYDRODIURIL) 25 MG tablet Take 1 tablet (25 mg total) by mouth daily.   labetalol (NORMODYNE) 200 MG tablet Take 1 tablet (200 mg total) by mouth 2 (two) times daily.   losartan (COZAAR) 50 MG tablet Take 1 tablet (50 mg total) by mouth daily.   nicotine (NICODERM CQ) 14 mg/24hr patch Place 1 patch (14 mg total) onto the skin daily.     Allergies:    Penicillins   Past Medical History: Past Medical History:  Diagnosis Date   Benign hypertension    High blood pressure    Hypercholesteremia     Past Surgical History: Past Surgical History:  Procedure Laterality Date   VENTRAL HERNIA REPAIR N/A 07/24/2022   Procedure: OPEN HERNIA REPAIR VENTRAL ADULT WITH MESH;  Surgeon: Harriette Bouillon, MD;  Location: [facility] OR;  Service: General;  Laterality: N/A;    Social History: Social History   Tobacco Use   Smoking status: Some Days    Current packs/day: 0.00    Types: Cigarettes    Last attempt to quit: 12/27/2016    Years since quitting: 6.5   Smokeless tobacco: Never  Vaping Use  Vaping status: Some Days  Substance Use Topics   Alcohol use: Yes    Comment: socially   Drug use: Yes    Types: Marijuana    Family History: Family History  Problem Relation Age of Onset   Hypertension Mother    Congestive Heart Failure Mother    Diabetes Father    Blindness Father    Hypertension Maternal Grandmother    Congestive Heart Failure Maternal Grandmother    Hypertension Maternal Grandfather    Congestive Heart Failure Maternal Grandfather     COPD Maternal Grandfather     ROS:   Review of Systems  Cardiovascular:  Positive for chest pain and dyspnea on exertion. Negative for claudication, irregular heartbeat, leg swelling, near-syncope, orthopnea, palpitations, paroxysmal nocturnal dyspnea and syncope.  Hematologic/Lymphatic: Negative for bleeding problem.    EKGs/Labs/Other Studies Reviewed:   EKG: EKG Interpretation Date/Time:  Tuesday July 16 2023 14:01:43 EDT Ventricular Rate:  91 PR Interval:  138 QRS Duration:  94 QT Interval:  352 QTC Calculation: 432 R Axis:   81  Text Interpretation: Normal sinus rhythm Nonspecific ST abnormality When compared with ECG of 18-Jul-2016 17:35, No significant change was found Confirmed by Tessa Lerner [phone]) on 07/16/2023 2:15:01 PM  Echocardiogram: NA  Labs:    Latest Ref Rng & Units 06/14/2023   10:38 AM 03/07/2023   12:47 PM 07/24/2022    8:07 AM  CBC  WBC 3.4 - 10.8 x10E3/uL 6.3  6.4  5.9   Hemoglobin 13.0 - 17.7 g/dL 60.4  54.0  98.1   Hematocrit 37.5 - 51.0 % 47.3  45.2  46.0   Platelets 150 - 450 x10E3/uL 232  239  209        Latest Ref Rng & Units 06/14/2023   10:38 AM 04/03/2023   12:19 PM 03/07/2023   12:47 PM  BMP  Glucose 70 - 99 mg/dL 191  96  98   BUN 6 - 20 mg/dL 12  16  14    Creatinine 0.76 - 1.27 mg/dL 4.78  2.95  6.21   BUN/Creat Ratio 9 - 20 10  14  13    Sodium 134 - 144 mmol/L 141  140  142   Potassium 3.5 - 5.2 mmol/L 4.1  4.3  4.7   Chloride 96 - 106 mmol/L 99  100  103   CO2 20 - 29 mmol/L 29  23  25    Calcium 8.7 - 10.2 mg/dL 9.6  9.1  8.9       Latest Ref Rng & Units 06/14/2023   10:38 AM 04/03/2023   12:19 PM 03/07/2023   12:47 PM  CMP  Glucose 70 - 99 mg/dL 308  96  98   BUN 6 - 20 mg/dL 12  16  14    Creatinine 0.76 - 1.27 mg/dL 6.57  8.46  9.62   Sodium 134 - 144 mmol/L 141  140  142   Potassium 3.5 - 5.2 mmol/L 4.1  4.3  4.7   Chloride 96 - 106 mmol/L 99  100  103   CO2 20 - 29 mmol/L 29  23  25    Calcium 8.7 - 10.2 mg/dL 9.6   9.1  8.9   Total Protein 6.0 - 8.5 g/dL 7.0   6.7   Total Bilirubin 0.0 - 1.2 mg/dL 0.3   0.3   Alkaline Phos 44 - 121 IU/L 73   89   AST 0 - 40 IU/L 24   25   ALT  0 - 44 IU/L 34   23     Lab Results  Component Value Date   CHOL 299 (H) 06/14/2023   HDL 45 06/14/2023   LDLCALC 214 (H) 06/14/2023   TRIG 207 (H) 06/14/2023   CHOLHDL 6.6 (H) 06/14/2023   No results for input(s): "LIPOA" in the last 8760 hours. No components found for: "NTPROBNP" No results for input(s): "PROBNP" in the last 8760 hours. No results for input(s): "TSH" in the last 8760 hours.  Physical Exam:    Today's Vitals   07/16/23 1359  BP: (!) 160/90  Pulse: 91  Resp: 12  SpO2: 93%  Weight: (!) 363 lb 12.8 oz (165 kg)  Height: 5\' 10"  (1.778 m)   Body mass index is 52.2 kg/m. Wt Readings from Last 3 Encounters:  07/16/23 (!) 363 lb 12.8 oz (165 kg)  06/14/23 (!) 345 lb (156.5 kg)  03/19/23 (!) 345 lb (156.5 kg)    Physical Exam  Constitutional: No distress.  hemodynamically stable  Neck: No JVD present.  Cardiovascular: Normal rate, regular rhythm, S1 normal and S2 normal. Exam reveals no gallop, no S3 and no S4.  No murmur heard. Pulses:      Dorsalis pedis pulses are 2+ on the right side and 2+ on the left side.       Posterior tibial pulses are 2+ on the right side and 2+ on the left side.  Pulmonary/Chest: Effort normal and breath sounds normal. No stridor. He has no wheezes. He has no rales.  Musculoskeletal:        General: Edema (trace bilateral) present.     Cervical back: Neck supple.  Skin: Skin is warm.    Impression & Recommendation(s):  Impression:   ICD-10-CM   1. Precordial pain  R07.2 ECHOCARDIOGRAM COMPLETE    ECHOCARDIOGRAM STRESS TEST    CT CARDIAC SCORING (SELF PAY ONLY)    2. Dyspnea on exertion  R06.09     3. Nonspecific abnormal electrocardiogram (ECG) (EKG)  R94.31 EKG 12-Lead    4. Benign hypertension  I10 Aldosterone + renin activity w/ ratio    TSH     Cortisol    Metanephrines, urine, 24 hour    Metanephrines, plasma    Protein / creatinine ratio, urine    labetalol (NORMODYNE) 200 MG tablet    losartan (COZAAR) 50 MG tablet    Protein / creatinine ratio, urine    Metanephrines, plasma    Metanephrines, urine, 24 hour    Cortisol    TSH    Aldosterone + renin activity w/ ratio    5. Hypercholesteremia  E78.00     6. Cigarette smoker  F17.210     7. Marijuana abuse  F12.10        Recommendation(s):  Precordial pain Symptoms suggestive of mixed features. No active pain. EKG is nonischemic. Reemphasized importance of improving his modifiable cardiovascular risk factors. Coronary calcium score for further risk stratification Echo will be ordered to evaluate for structural heart disease and left ventricular systolic function. Stress echo to evaluate for functional capacity, pre and post LV function.  Recommend starting antihypertensive medications prior to scheduling a stress echo to hopefully prevent hypertensive response to exercise. Educated him on seeking medical attention sooner by going to the closest ER via EMS if the symptoms increase in intensity, frequency, duration, or has typical chest pain as discussed in the office.  Patient verbalized understanding.  Dyspnea on exertion Multifactorial Ischemic workup as discussed above. Plan echo  as discussed above. Check NT proBNP. Reemphasized importance of complete smoking cessation. Reemphasized the importance of complete cessation of marijuana, consumes 2 to 3 g/day. Recommend being evaluated by pulmonary medicine as well. Ideally will need to be screened for sleep apnea as well.  Benign hypertension States that he was diagnosed in 2019. Does not check his blood pressures at home. Only on hydrochlorothiazide 25 mg p.o. every morning. I suspect that he may have secondary hypertension given the onset of he has been hypertension diagnoses. Prior to starting ARB's/MRA's  would like to check morning cortisol level, renin Aldo ratio, plasma and urine metanephrines, urine to protein creatinine ratio (of note he is on hydrochlorothiazide).  Recommended that he get them done first thing in the morning. Ideally would benefit from a renal duplex; however, will hold off for now as the overall diagnostic yield would be low given his BMI of 52. Start labetalol 100 mg p.o. twice daily. Start losartan 50 mg p.o. every afternoon  Hypercholesteremia Labs performed in February 2025, fasting (confirmed by patient) illustrate a total cholesterol of 299, LDL of 214, triglycerides of 207 Patient was prescribed atorvastatin 40 mg p.o. nightly but has not started it. Recommend initiating pharmacological therapy Reemphasized importance of reducing foods that are high in triglycerides and lipids. Coronary calcium score for further risk stratification  Cigarette smoker Smoking since the age of 14, 0.5 packs/day. Currently smoking 1 cigarette/day. Reemphasized importance of complete smoking cessation. Tobacco cessation counseling: Currently smoking 1 cigarette/day  He is informed of the dangers of tobacco abuse including stroke, cancer, and MI, as well as benefits of tobacco cessation. He is willing to quit at this time. 5 mins were spent counseling patient cessation techniques. We discussed various methods to help quit smoking, including deciding on a date to quit, joining a support group, pharmacological agents- nicotine gum/patch/lozenges.  I will reassess his progress at the next follow-up visit  Marijuana abuse Endorses consuming 2 to 3 g of marijuana daily. Reemphasized importance of complete cessation.   Orders Placed:  Orders Placed This Encounter  Procedures   CT CARDIAC SCORING (SELF PAY ONLY)    Standing Status:   Future    Expiration Date:   07/15/2024    Preferred imaging location?:      Aldosterone + renin activity w/ ratio    Standing Status:    Future    Number of Occurrences:   1    Expected Date:   07/17/2023    Expiration Date:   07/15/2024   TSH    Standing Status:   Future    Number of Occurrences:   1    Expected Date:   07/17/2023    Expiration Date:   07/15/2024   Cortisol    Standing Status:   Future    Number of Occurrences:   1    Expected Date:   07/17/2023    Expiration Date:   07/15/2024   Metanephrines, urine, 24 hour    Standing Status:   Future    Number of Occurrences:   1    Expected Date:   07/17/2023    Expiration Date:   07/15/2024   Metanephrines, plasma    Standing Status:   Future    Number of Occurrences:   1    Expected Date:   07/17/2023    Expiration Date:   07/15/2024   Protein / creatinine ratio, urine    Standing Status:   Future    Number of  Occurrences:   1    Expected Date:   07/17/2023    Expiration Date:   07/15/2024   EKG 12-Lead   ECHOCARDIOGRAM COMPLETE    Standing Status:   Future    Expected Date:   07/23/2023    Expiration Date:   07/15/2024    Where should this test be performed:   [facility])    Does the patient weigh less than or greater than 250 lbs?:   Patient weighs greater than 250 lbs             363 lbs    Perflutren DEFINITY (image enhancing agent) should be administered unless hypersensitivity or allergy exist:   Administer Perflutren    Reason for exam-Echo:   Other-Full Diagnosis List    Full ICD-10/Reason for Exam:   Precordial pain [786.51.ICD-9-CM]   ECHOCARDIOGRAM STRESS TEST    Standing Status:   Future    Expected Date:   07/23/2023    Expiration Date:   07/15/2024    Where should this be performed?:   [facility])    Perflutren DEFINITY (image enhancing agent) should be administered unless hypersensitivity or allergy exist:   Administer Perflutren    Stress with pharmacologic or treadmill ?:   Treadmill w/ exercise     Final Medication List:    Meds ordered this encounter  Medications   labetalol (NORMODYNE) 200  MG tablet    Sig: Take 1 tablet (200 mg total) by mouth 2 (two) times daily.    Dispense:  60 tablet    Refill:  3   losartan (COZAAR) 50 MG tablet    Sig: Take 1 tablet (50 mg total) by mouth daily.    Dispense:  30 tablet    Refill:  3    There are no discontinued medications.   Current Outpatient Medications:    acetaminophen (TYLENOL) 500 MG tablet, Take 1,000 mg by mouth 2 (two) times daily as needed for moderate pain., Disp: , Rfl:    benzonatate (TESSALON PERLES) 100 MG capsule, Take 1 capsule (100 mg total) by mouth 3 (three) times daily as needed for cough., Disp: 30 capsule, Rfl: 1   hydrochlorothiazide (HYDRODIURIL) 25 MG tablet, Take 1 tablet (25 mg total) by mouth daily., Disp: 30 tablet, Rfl: 5   labetalol (NORMODYNE) 200 MG tablet, Take 1 tablet (200 mg total) by mouth 2 (two) times daily., Disp: 60 tablet, Rfl: 3   losartan (COZAAR) 50 MG tablet, Take 1 tablet (50 mg total) by mouth daily., Disp: 30 tablet, Rfl: 3   nicotine (NICODERM CQ) 14 mg/24hr patch, Place 1 patch (14 mg total) onto the skin daily., Disp: 28 patch, Rfl: 2   Albuterol-Budesonide (AIRSUPRA) 90-80 MCG/ACT AERO, Inhale 2 puffs into the lungs every 4 (four) hours as needed. (Patient not taking: Reported on 07/16/2023), Disp: 32.1 g, Rfl: 3   atorvastatin (LIPITOR) 40 MG tablet, Take 1 tablet (40 mg total) by mouth daily. (Patient not taking: Reported on 07/16/2023), Disp: 30 tablet, Rfl: 6   Semaglutide-Weight Management (WEGOVY) 0.25 MG/0.5ML SOAJ, Inject 0.25 mg into the skin every 7 (seven) days. (Patient not taking: Reported on 07/16/2023), Disp: 2 mL, Rfl: 1  Consent:   Informed Consent   Shared Decision Making/Informed Consent The risks [chest pain, shortness of breath, cardiac arrhythmias, dizziness, blood pressure fluctuations, myocardial infarction, stroke/transient ischemic attack, and life-threatening complications (estimated to be 1 in 10,000)], benefits (risk stratification, diagnosing coronary  artery  disease, treatment guidance) and alternatives of a  stress echocardiogram were discussed in detail with Mr. Gladden and he agrees to proceed.     Disposition:   8-week follow-up sooner if needed   His questions and concerns were addressed to his satisfaction. He voices understanding of the recommendations provided during this encounter.    Signed, Tessa Lerner, DO, [facility] [place]  [facility]  [address] [place], [place] [address] 07/16/2023 8:05 PM

## 2023-07-17 DIAGNOSIS — I10 Essential (primary) hypertension: Secondary | ICD-10-CM | POA: Diagnosis not present

## 2023-07-18 DIAGNOSIS — I10 Essential (primary) hypertension: Secondary | ICD-10-CM | POA: Diagnosis not present

## 2023-07-22 ENCOUNTER — Encounter: Payer: Self-pay | Admitting: Cardiology

## 2023-07-23 ENCOUNTER — Other Ambulatory Visit: Payer: Self-pay

## 2023-07-23 DIAGNOSIS — I10 Essential (primary) hypertension: Secondary | ICD-10-CM

## 2023-07-27 LAB — METANEPHRINES, PLASMA
Metanephrine, Free: 52 pg/mL (ref 0.0–88.0)
Normetanephrine, Free: 147.9 pg/mL (ref 0.0–210.1)

## 2023-07-27 LAB — METANEPHRINES, URINE, 24 HOUR
Metaneph Total, Ur: 99 ug/L
Metanephrines, 24H Ur: 238 ug/(24.h) (ref 58–276)
Normetanephrine, 24H Ur: 804 ug/(24.h) — ABNORMAL HIGH (ref 156–729)
Normetanephrine, Ur: 335 ug/L

## 2023-07-27 LAB — PROTEIN / CREATININE RATIO, URINE
Creatinine, Urine: 107.7 mg/dL
Protein, Ur: 67.2 mg/dL
Protein/Creat Ratio: 624 mg/g{creat} — ABNORMAL HIGH (ref 0–200)

## 2023-07-27 LAB — ALDOSTERONE + RENIN ACTIVITY W/ RATIO
Aldos/Renin Ratio: 7.6 (ref 0.0–30.0)
Aldosterone: 8.8 ng/dL (ref 0.0–30.0)
Renin Activity, Plasma: 1.165 ng/mL/h (ref 0.167–5.380)

## 2023-07-27 LAB — CORTISOL: Cortisol: 10 ug/dL (ref 6.2–19.4)

## 2023-07-27 LAB — TSH: TSH: 0.509 u[IU]/mL (ref 0.450–4.500)

## 2023-08-20 ENCOUNTER — Ambulatory Visit (HOSPITAL_COMMUNITY): Attending: Internal Medicine

## 2023-08-20 DIAGNOSIS — I517 Cardiomegaly: Secondary | ICD-10-CM | POA: Diagnosis not present

## 2023-08-20 DIAGNOSIS — R072 Precordial pain: Secondary | ICD-10-CM | POA: Insufficient documentation

## 2023-08-20 LAB — ECHOCARDIOGRAM COMPLETE
Area-P 1/2: 3.03 cm2
Est EF: 50
S' Lateral: 3.3 cm

## 2023-08-23 ENCOUNTER — Encounter: Payer: Self-pay | Admitting: Cardiology

## 2023-08-23 ENCOUNTER — Encounter (HOSPITAL_COMMUNITY): Payer: Self-pay

## 2023-08-27 ENCOUNTER — Ambulatory Visit (HOSPITAL_BASED_OUTPATIENT_CLINIC_OR_DEPARTMENT_OTHER): Admission: RE | Admit: 2023-08-27 | Source: Ambulatory Visit

## 2023-08-29 ENCOUNTER — Other Ambulatory Visit: Payer: Self-pay

## 2023-08-29 DIAGNOSIS — R072 Precordial pain: Secondary | ICD-10-CM

## 2023-08-30 ENCOUNTER — Observation Stay (HOSPITAL_COMMUNITY)
Admission: EM | Admit: 2023-08-30 | Discharge: 2023-08-31 | Disposition: A | Discharge status: Home / Self Care | Attending: Internal Medicine | Admitting: Internal Medicine

## 2023-08-30 ENCOUNTER — Other Ambulatory Visit: Payer: Self-pay

## 2023-08-30 ENCOUNTER — Encounter (HOSPITAL_COMMUNITY): Payer: Self-pay | Admitting: Emergency Medicine

## 2023-08-30 ENCOUNTER — Emergency Department (HOSPITAL_COMMUNITY)

## 2023-08-30 ENCOUNTER — Encounter (HOSPITAL_COMMUNITY): Payer: Self-pay | Admitting: Radiology

## 2023-08-30 ENCOUNTER — Ambulatory Visit (HOSPITAL_COMMUNITY)

## 2023-08-30 ENCOUNTER — Encounter: Payer: Self-pay | Admitting: Cardiology

## 2023-08-30 ENCOUNTER — Ambulatory Visit (HOSPITAL_BASED_OUTPATIENT_CLINIC_OR_DEPARTMENT_OTHER)

## 2023-08-30 DIAGNOSIS — F17210 Nicotine dependence, cigarettes, uncomplicated: Secondary | ICD-10-CM | POA: Diagnosis not present

## 2023-08-30 DIAGNOSIS — Z1152 Encounter for screening for COVID-19: Secondary | ICD-10-CM | POA: Insufficient documentation

## 2023-08-30 DIAGNOSIS — R9439 Abnormal result of other cardiovascular function study: Secondary | ICD-10-CM | POA: Diagnosis not present

## 2023-08-30 DIAGNOSIS — I10 Essential (primary) hypertension: Secondary | ICD-10-CM | POA: Insufficient documentation

## 2023-08-30 DIAGNOSIS — F1090 Alcohol use, unspecified, uncomplicated: Secondary | ICD-10-CM | POA: Diagnosis not present

## 2023-08-30 DIAGNOSIS — Z79899 Other long term (current) drug therapy: Secondary | ICD-10-CM | POA: Diagnosis not present

## 2023-08-30 DIAGNOSIS — Z7901 Long term (current) use of anticoagulants: Secondary | ICD-10-CM | POA: Diagnosis not present

## 2023-08-30 DIAGNOSIS — R072 Precordial pain: Secondary | ICD-10-CM | POA: Diagnosis not present

## 2023-08-30 DIAGNOSIS — E876 Hypokalemia: Secondary | ICD-10-CM | POA: Diagnosis not present

## 2023-08-30 DIAGNOSIS — F1290 Cannabis use, unspecified, uncomplicated: Secondary | ICD-10-CM | POA: Diagnosis not present

## 2023-08-30 DIAGNOSIS — J42 Unspecified chronic bronchitis: Secondary | ICD-10-CM | POA: Diagnosis not present

## 2023-08-30 DIAGNOSIS — R0602 Shortness of breath: Secondary | ICD-10-CM | POA: Diagnosis not present

## 2023-08-30 LAB — BASIC METABOLIC PANEL WITH GFR
Anion gap: 11 (ref 5–15)
BUN: 12 mg/dL (ref 6–20)
CO2: 29 mmol/L (ref 22–32)
Calcium: 9.6 mg/dL (ref 8.9–10.3)
Chloride: 99 mmol/L (ref 98–111)
Creatinine, Ser: 1.24 mg/dL (ref 0.61–1.24)
GFR, Estimated: >60 mL/min (ref >60)
Glucose, Bld: 127 mg/dL — ABNORMAL HIGH (ref 70–99)
Potassium: 3.4 mmol/L — ABNORMAL LOW (ref 3.5–5.1)
Sodium: 139 mmol/L (ref 135–145)

## 2023-08-30 LAB — CBC
HCT: 45.3 % (ref 39.0–52.0)
Hemoglobin: 14.9 g/dL (ref 13.0–17.0)
MCH: 26.3 pg (ref 26.0–34.0)
MCHC: 32.9 g/dL (ref 30.0–36.0)
MCV: 79.9 fL — ABNORMAL LOW (ref 80.0–100.0)
Platelets: 215 10*3/uL (ref 150–400)
RBC: 5.67 MIL/uL (ref 4.22–5.81)
RDW: 14.4 % (ref 11.5–15.5)
WBC: 7.3 10*3/uL (ref 4.0–10.5)
nRBC: 0 % (ref 0.0–0.2)

## 2023-08-30 LAB — BRAIN NATRIURETIC PEPTIDE: B Natriuretic Peptide: 38 pg/mL (ref 0.0–100.0)

## 2023-08-30 LAB — RESP PANEL BY RT-PCR (RSV, FLU A&B, COVID)  RVPGX2
Influenza A by PCR: NEGATIVE
Influenza B by PCR: NEGATIVE
Resp Syncytial Virus by PCR: NEGATIVE
SARS Coronavirus 2 by RT PCR: NEGATIVE

## 2023-08-30 LAB — RAPID URINE DRUG SCREEN, HOSP PERFORMED
Amphetamines: NOT DETECTED
Barbiturates: NOT DETECTED
Benzodiazepines: NOT DETECTED
Cocaine: NOT DETECTED
Opiates: NOT DETECTED
Tetrahydrocannabinol: NOT DETECTED

## 2023-08-30 LAB — TROPONIN I (HIGH SENSITIVITY): Troponin I (High Sensitivity): 47 ng/L — ABNORMAL HIGH (ref <18)

## 2023-08-30 LAB — MAGNESIUM: Magnesium: 1.7 mg/dL (ref 1.7–2.4)

## 2023-08-30 MED ORDER — SENNOSIDES-DOCUSATE SODIUM 8.6-50 MG PO TABS: 1.0000 | ORAL_TABLET | Freq: Every evening | ORAL | Status: DC | PRN

## 2023-08-30 MED ORDER — SODIUM CHLORIDE 0.9% FLUSH
3.0000 mL | Freq: Two times a day (BID) | INTRAVENOUS | Status: DC
  Administered 2023-08-31: 3 mL via INTRAVENOUS

## 2023-08-30 MED ORDER — PREDNISONE 20 MG PO TABS: 60.0000 mg | ORAL_TABLET | Freq: Once | ORAL | Status: DC

## 2023-08-30 MED ORDER — PERFLUTREN LIPID MICROSPHERE
1.0000 mL | INTRAVENOUS | Status: AC | PRN
  Administered 2023-08-30: 6 mL via INTRAVENOUS

## 2023-08-30 MED ORDER — LABETALOL HCL 200 MG PO TABS
200.0000 mg | ORAL_TABLET | Freq: Two times a day (BID) | ORAL | Status: DC
  Administered 2023-08-31: 200 mg via ORAL
  Filled 2023-08-30: qty 1

## 2023-08-30 MED ORDER — OXYCODONE HCL 5 MG PO TABS: 5.0000 mg | ORAL_TABLET | ORAL | Status: DC | PRN

## 2023-08-30 MED ORDER — MAGNESIUM SULFATE IN D5W 1-5 GM/100ML-% IV SOLN
1.0000 g | Freq: Once | INTRAVENOUS | Status: AC
Start: 2023-08-31 — End: 2023-08-31
  Administered 2023-08-31: 1 g via INTRAVENOUS
  Filled 2023-08-30: qty 100

## 2023-08-30 MED ORDER — IPRATROPIUM-ALBUTEROL 0.5-2.5 (3) MG/3ML IN SOLN
3.0000 mL | RESPIRATORY_TRACT | Status: AC
  Administered 2023-08-30 (×3): 3 mL via RESPIRATORY_TRACT
  Filled 2023-08-30: qty 9

## 2023-08-30 MED ORDER — HYDROCHLOROTHIAZIDE 25 MG PO TABS: 25.0000 mg | ORAL_TABLET | Freq: Every day | ORAL | Status: DC

## 2023-08-30 MED ORDER — ACETAMINOPHEN 325 MG PO TABS: 650.0000 mg | ORAL_TABLET | Freq: Four times a day (QID) | ORAL | Status: DC | PRN

## 2023-08-30 MED ORDER — ENOXAPARIN SODIUM 80 MG/0.8ML IJ SOSY
80.0000 mg | PREFILLED_SYRINGE | INTRAMUSCULAR | Status: DC
  Filled 2023-08-30: qty 0.8

## 2023-08-30 MED ORDER — IOHEXOL 350 MG/ML SOLN
75.0000 mL | Freq: Once | INTRAVENOUS | Status: AC | PRN
  Administered 2023-08-30: 75 mL via INTRAVENOUS

## 2023-08-30 MED ORDER — LOSARTAN POTASSIUM 50 MG PO TABS: 50.0000 mg | ORAL_TABLET | Freq: Every day | ORAL | Status: DC

## 2023-08-30 MED ORDER — ONDANSETRON HCL 4 MG PO TABS: 4.0000 mg | ORAL_TABLET | Freq: Four times a day (QID) | ORAL | Status: DC | PRN

## 2023-08-30 MED ORDER — ACETAMINOPHEN 650 MG RE SUPP: 650.0000 mg | Freq: Four times a day (QID) | RECTAL | Status: DC | PRN

## 2023-08-30 MED ORDER — ONDANSETRON HCL 4 MG/2ML IJ SOLN: 4.0000 mg | Freq: Four times a day (QID) | INTRAMUSCULAR | Status: DC | PRN

## 2023-08-30 MED ORDER — POTASSIUM CHLORIDE CRYS ER 20 MEQ PO TBCR
20.0000 meq | EXTENDED_RELEASE_TABLET | Freq: Once | ORAL | Status: AC
  Administered 2023-08-31: 20 meq via ORAL
  Filled 2023-08-30: qty 1

## 2023-08-30 MED ORDER — IPRATROPIUM-ALBUTEROL 0.5-2.5 (3) MG/3ML IN SOLN: 3.0000 mL | Freq: Four times a day (QID) | RESPIRATORY_TRACT | Status: DC | PRN

## 2023-08-30 MED ORDER — ASPIRIN 81 MG PO CHEW
324.0000 mg | CHEWABLE_TABLET | Freq: Once | ORAL | Status: AC
  Administered 2023-08-31: 324 mg via ORAL
  Filled 2023-08-30: qty 4

## 2023-08-30 NOTE — H&P (Signed)
 History and Physical    Brian Mclean EAV:[id number] DOB: 1985-06-15 DOA: 08/30/2023  PCP: Melodie Spry, NP   Patient coming from: Home   Chief Complaint: Abnormal stress test, SOB   HPI: Brian Mclean is a 38 y.o. male with medical history significant for hypertension, hyperlipidemia, chronic bronchitis, and BMI 52 who presents at the direction of his cardiologist for further evaluation of abnormal stress test.  Patient had been referred to cardiology for evaluation of chest discomfort and shortness of breath with activities.  He underwent echocardiogram stress test today, developed hypotension during exercise, and had RV apical hypokinesis that resolved with stress.  He was directed to the ED for further evaluation and management.  Patient states that he has been suffering from upper respiratory illness with nasal congestion and cough for a few days, but has not been experiencing chest pain.  He reports history of bilateral lower extremity swelling, but states that this has improved since he started HCTZ.  ED Course: Upon arrival to the ED, patient is found to be afebrile and saturating well on room air with mild tachycardia and elevated blood pressure.  Labs are most notable for potassium 3.4, creatinine 1.24, normal WBC, normal BNP, and troponin 47.  Patient was treated in the ED with 324 mg aspirin and DuoNeb x 3  Review of Systems:  All other systems reviewed and apart from HPI, are negative.  Past Medical History:  Diagnosis Date   Benign hypertension    High blood pressure    Hypercholesteremia     Past Surgical History:  Procedure Laterality Date   VENTRAL HERNIA REPAIR N/A 07/24/2022   Procedure: OPEN HERNIA REPAIR VENTRAL ADULT WITH MESH;  Surgeon: Sim Dryer, MD;  Location: [facility] OR;  Service: General;  Laterality: N/A;    Social History:   reports that he has been smoking cigarettes. He has never used smokeless tobacco. He reports current alcohol use. He reports  current drug use. Drug: Marijuana.  Allergies  Allergen Reactions   Penicillins Swelling    Has patient had a PCN reaction causing immediate rash, facial/tongue/throat swelling, SOB or lightheadedness with hypotension: Unknown Has patient had a PCN reaction causing severe rash involving mucus membranes or skin necrosis: Unknown Has patient had a PCN reaction that required hospitalization: Unknown Has patient had a PCN reaction occurring within the last 10 years: No If all of the above answers are "NO", then may proceed with Cephalosporin use.     Family History  Problem Relation Age of Onset   Hypertension Mother    Congestive Heart Failure Mother    Diabetes Father    Blindness Father    Hypertension Maternal Grandmother    Congestive Heart Failure Maternal Grandmother    Hypertension Maternal Grandfather    Congestive Heart Failure Maternal Grandfather    COPD Maternal Grandfather      Prior to Admission medications   Medication Sig Start Date End Date Taking? Authorizing Provider  acetaminophen  (TYLENOL ) 500 MG tablet Take 1,000 mg by mouth 2 (two) times daily as needed for moderate pain.    [provider]  Albuterol -Budesonide (AIRSUPRA ) 90-80 MCG/ACT AERO Inhale 2 puffs into the lungs every 4 (four) hours as needed. Patient not taking: Reported on 07/16/2023 06/12/23   Melodie Spry, NP  atorvastatin  (LIPITOR) 40 MG tablet Take 1 tablet (40 mg total) by mouth daily. Patient not taking: Reported on 07/16/2023 06/19/23 06/18/24  Melodie Spry, NP  benzonatate  (TESSALON  PERLES) 100 MG capsule Take 1  capsule (100 mg total) by mouth 3 (three) times daily as needed for cough. 06/12/23 06/11/24  Melodie Spry, NP  hydrochlorothiazide  (HYDRODIURIL ) 25 MG tablet Take 1 tablet (25 mg total) by mouth daily. 06/12/23   Melodie Spry, NP  labetalol  (NORMODYNE ) 200 MG tablet Take 1 tablet (200 mg total) by mouth 2 (two) times daily. 07/16/23   Tolia, Sunit, DO  losartan  (COZAAR ) 50 MG tablet  Take 1 tablet (50 mg total) by mouth daily. 07/16/23   Tolia, Sunit, DO  nicotine  (NICODERM CQ ) 14 mg/24hr patch Place 1 patch (14 mg total) onto the skin daily. 06/12/23   Melodie Spry, NP  Semaglutide -Weight Management (WEGOVY ) 0.25 MG/0.5ML SOAJ Inject 0.25 mg into the skin every 7 (seven) days. Patient not taking: Reported on 07/16/2023 03/07/23   Melodie Spry, NP  albuterol  (PROVENTIL  HFA;VENTOLIN  HFA) 108 (90 Base) MCG/ACT inhaler Inhale 1-2 puffs into the lungs every 6 (six) hours as needed for wheezing or shortness of breath. 01/21/18 02/15/20  Landa Pine, FNP    Physical Exam: Vitals:   08/30/23 1718 08/30/23 2047 08/30/23 2108  BP: (!) 152/100  (!) 174/120  Pulse: (!) 114  85  Resp: 17  16  Temp: 98.1 F (36.7 C)  98.1 F (36.7 C)  TempSrc: Oral  Oral  SpO2: 94% 98% 99%    Constitutional: NAD, calm  Eyes: PERTLA, lids and conjunctivae normal ENMT: Mucous membranes are moist. Posterior pharynx clear of any exudate or lesions.   Neck: supple, no masses  Respiratory: no wheezing, no crackles. No accessory muscle use.  Cardiovascular: S1 & S2 heard, regular rate and rhythm. Mild lower extremity edema.   Abdomen: No tenderness, soft. Bowel sounds active.  Musculoskeletal: no clubbing / cyanosis. No joint deformity upper and lower extremities.   Skin: no significant rashes, lesions, ulcers. Warm, dry, well-perfused. Neurologic: CN 2-12 grossly intact. Moving all extremities. Alert and oriented.  Psychiatric: Pleasant. Cooperative.    Labs and Imaging on Admission: I have personally reviewed following labs and imaging studies  CBC: Recent Labs  Lab 08/30/23 1719  WBC 7.3  HGB 14.9  HCT 45.3  MCV 79.9*  PLT 215   Basic Metabolic Panel: Recent Labs  Lab 08/30/23 1719  NA 139  K 3.4*  CL 99  CO2 29  GLUCOSE 127*  BUN 12  CREATININE 1.24  CALCIUM  9.6  MG 1.7   GFR: CrCl cannot be calculated (Unknown ideal weight.). Liver Function Tests: No results for  input(s): "AST", "ALT", "ALKPHOS", "BILITOT", "PROT", "ALBUMIN" in the last 168 hours. No results for input(s): "LIPASE", "AMYLASE" in the last 168 hours. No results for input(s): "AMMONIA" in the last 168 hours. Coagulation Profile: No results for input(s): "INR", "PROTIME" in the last 168 hours. Cardiac Enzymes: No results for input(s): "CKTOTAL", "CKMB", "CKMBINDEX", "TROPONINI" in the last 168 hours. BNP (last 3 results) No results for input(s): "PROBNP" in the last 8760 hours. HbA1C: No results for input(s): "HGBA1C" in the last 72 hours. CBG: No results for input(s): "GLUCAP" in the last 168 hours. Lipid Profile: No results for input(s): "CHOL", "HDL", "LDLCALC", "TRIG", "CHOLHDL", "LDLDIRECT" in the last 72 hours. Thyroid Function Tests: No results for input(s): "TSH", "T4TOTAL", "FREET4", "T3FREE", "THYROIDAB" in the last 72 hours. Anemia Panel: No results for input(s): "VITAMINB12", "FOLATE", "FERRITIN", "TIBC", "IRON", "RETICCTPCT" in the last 72 hours. Urine analysis:    Component Value Date/Time   COLORURINE STRAW (A) 07/23/2022 1800   APPEARANCEUR CLEAR 07/23/2022 1800   LABSPEC >1.046 (H)  07/23/2022 1800   PHURINE 5.0 07/23/2022 1800   GLUCOSEU NEGATIVE 07/23/2022 1800   HGBUR NEGATIVE 07/23/2022 1800   BILIRUBINUR NEGATIVE 07/23/2022 1800   KETONESUR NEGATIVE 07/23/2022 1800   PROTEINUR 30 (A) 07/23/2022 1800   UROBILINOGEN 0.2 06/28/2021 1237   NITRITE NEGATIVE 07/23/2022 1800   LEUKOCYTESUR NEGATIVE 07/23/2022 1800   Sepsis Labs: @LABRCNTIP (procalcitonin:4,lacticidven:4) ) Recent Results (from the past 240 hours)  Resp panel by RT-PCR (RSV, Flu A&B, Covid) Anterior Nasal Swab     Status: None   Collection Time: 08/30/23  8:10 PM   Specimen: Anterior Nasal Swab  Result Value Ref Range Status   SARS Coronavirus 2 by RT PCR NEGATIVE NEGATIVE Final   Influenza A by PCR NEGATIVE NEGATIVE Final   Influenza B by PCR NEGATIVE NEGATIVE Final    Comment:  (NOTE) The Xpert Xpress SARS-CoV-2/FLU/RSV plus assay is intended as an aid in the diagnosis of influenza from Nasopharyngeal swab specimens and should not be used as a sole basis for treatment. Nasal washings and aspirates are unacceptable for Xpert Xpress SARS-CoV-2/FLU/RSV testing.  Fact Sheet for Patients: BloggerCourse.com  Fact Sheet for Healthcare Providers: SeriousBroker.it  This test is not yet approved or cleared by the [place]  FDA and has been authorized for detection and/or diagnosis of SARS-CoV-2 by FDA under an Emergency Use Authorization (EUA). This EUA will remain in effect (meaning this test can be used) for the duration of the COVID-19 declaration under Section 564(b)(1) of the Act, 21 U.S.C. section 360bbb-3(b)(1), unless the authorization is terminated or revoked.     Resp Syncytial Virus by PCR NEGATIVE NEGATIVE Final    Comment: (NOTE) Fact Sheet for Patients: BloggerCourse.com  Fact Sheet for Healthcare Providers: SeriousBroker.it  This test is not yet approved or cleared by the [place]  FDA and has been authorized for detection and/or diagnosis of SARS-CoV-2 by FDA under an Emergency Use Authorization (EUA). This EUA will remain in effect (meaning this test can be used) for the duration of the COVID-19 declaration under Section 564(b)(1) of the Act, 21 U.S.C. section 360bbb-3(b)(1), unless the authorization is terminated or revoked.  Performed at [facility], [address], [place], [place] [address]      Radiological Exams on Admission: CT Angio Chest PE W and/or Wo Contrast Result Date: 08/30/2023 CLINICAL DATA:  Shortness of breath. EXAM: CT ANGIOGRAPHY CHEST WITH CONTRAST TECHNIQUE: Multidetector CT imaging of the chest was performed using the standard protocol during bolus administration of intravenous contrast. Multiplanar CT  image reconstructions and MIPs were obtained to evaluate the vascular anatomy. RADIATION DOSE REDUCTION: This exam was performed according to the departmental dose-optimization program which includes automated exposure control, adjustment of the mA and/or kV according to patient size and/or use of iterative reconstruction technique. CONTRAST:  75mL OMNIPAQUE  IOHEXOL  350 MG/ML SOLN COMPARISON:  None Available. FINDINGS: Cardiovascular: The thoracic aorta is unremarkable. The subsegmental pulmonary arteries are limited in evaluation secondary to suboptimal opacification with intravenous contrast. No evidence of pulmonary embolism. Normal heart size. No pericardial effusion. Mediastinum/Nodes: No enlarged mediastinal, hilar, or axillary lymph nodes. Thyroid gland, trachea, and esophagus demonstrate no significant findings. Lungs/Pleura: Lungs are clear. No pleural effusion or pneumothorax. Upper Abdomen: No acute abnormality. Musculoskeletal: No chest wall abnormality. No acute or significant osseous findings. Review of the MIP images confirms the above findings. IMPRESSION: No evidence of pulmonary embolism or acute cardiopulmonary disease. Electronically Signed   By: Virgle Grime M.D.   On: 08/30/2023 22:02   DG  Chest 2 View Result Date: 08/30/2023 CLINICAL DATA:  Shortness of breath EXAM: CHEST - 2 VIEW COMPARISON:  Chest radiograph 01/09/2012 FINDINGS: The heart size and mediastinal contours are within normal limits. Both lungs are clear. The visualized skeletal structures are unremarkable. IMPRESSION: No active cardiopulmonary disease. Electronically Signed   By: Jone Neither M.D.   On: 08/30/2023 18:31   ECHOCARDIOGRAM STRESS TEST Result Date: 08/30/2023     EXERCISE STRESS ECHO REPORT    -------------------------------------------------------------------------------- Patient Name:   Brian Mclean Date of Exam: 08/30/2023 Medical Rec #:  [id number]       Height:       70.0 in Accession #:    [id number]       Weight:       363.8 lb Date of Birth:  [birth year]       BSA:          2.692 m Patient Age:    38 years        BP:           137/92 mmHg Patient Gender: M               HR:           99 bpm. Exam Location:  [address] Procedure: Stress Echo and Intracardiac Opacification Agent Indications:    R07.2 Precordial pain  History:        Patient has prior history of Echocardiogram examinations, most                 recent 08/20/2023.  Sonographer:    Joleen Navy RDCS Referring Phys: [id number] SUNIT TOLIA IMPRESSIONS  1. This is an inconclusive stress echocardiogram for ischemia.  2. Prior to the study, no wall motion abnormalities.  3. Patient was not able to exercise for significant amount of time- met heart rate only on ECG portion of test; this is electrically negative for ischemia but inconclusive for ischemic by echo.  4. During the stress portion, exercise induced hypotension (a risk feature). D Shaped septum seen in systole. RV apical hypokinesis that resolves with stress. Differential is related to RV strain (Pulmonary embolism, exercise induced pulmonary hypertension). Frequent, polymorphic PVCs with couplets and bigeminy.  5. This is a high risk study.  6. Discussed with outpatient team; patient will be evaluated as per protocol. FINDINGS Exam Protocol: The patient exercised on a treadmill according to a Bruce protocol. Definity contrast agent was given IV to delineate the left ventricular endocardial borders.  Patient Performance: The patient exercised for 3 minutes achieving 4.6 METS. The maximum stage achieved was I of the Bruce protocol. The heart rate at peak stress was 160 bpm. The target heart rate was calculated to be 155 bpm. The percentage of maximum predicted heart rate achieved was 87.5 %. The baseline blood pressure was 137/92 mmHg. The blood pressure at peak stress was 95/58 mmHg. The patient developed fatigue and shortness of breath during the stress exam.  EKG: Resting EKG showed normal  sinus rhythm. The patient developed frequent premature ventricular contractions and frequent PVCs, multifocal with couplets and bigeminy during exercise.  2D Echo Findings: The baseline ejection fraction was 60%. The peak ejection fraction at stress was 80%. Baseline regional wall motion abnormalities were not present. This is an inconclusive stress echocardiogram for ischemia.  Gloriann Larger MD Electronically signed on 08/30/2023 at 3:40:28 PM    Final     EKG: Independently reviewed. Sinus tachycardia, rate 113, RAD.   Assessment/Plan  1. Abnormal stress test  - No chest discomfort currently  - Continue cardiac monitoring, trend troponin, follow-up on cardiology recommendations    2. Hypertension  - HCTZ, labetalol , losartan     3. Hypokalemia  - Replacing   4. Chronic bronchitis  - Bronchodilators as-needed     DVT prophylaxis: Lovenox   Code Status: Full  Level of Care: Level of care: Telemetry Cardiac Family Communication: Wife at bedside   Disposition Plan:  Patient is from: Home  Anticipated d/c is to: TBD Anticipated d/c date is: TBD Patient currently: Pending cardiology consultation  Consults called: Cardiology  Admission status: Observation     Walton Guppy, MD [facility]  08/30/2023, 10:47 PM

## 2023-08-30 NOTE — Progress Notes (Addendum)
 Spoke with Dr. Paulita Boss and Dr. Albert Huff regarding stress echocardiogram results. Per Dr. Albert Huff sent patient to [facility] ER. Patient stated he understood instructions to go to [facility] ER and that he would speak with wife and then go to ER. Confirmed with Dr. Albert Huff that patient was sent to [facility] ER.

## 2023-08-30 NOTE — ED Provider Triage Note (Signed)
 Emergency Medicine Provider Triage Evaluation Note  Brian Mclean , a 38 y.o. male  was evaluated in triage.  Pt complains of shortness of breath.  He was sent in from cardiology office due to abnormal stress echo.  I spoke with cardiologist Dr Albert Huff who stated patient had an abnormal stress echo and during exercise he became hypotensive along with having some right-sided enlargement.  He recommends PE rule out and patient will require additional ischemic workup.  After workup determination can be made under which service patient will get admitted under. UDS ordered at his direction.  Review of Systems  Positive: As above Negative: As above  Physical Exam  BP (!) 152/100 (BP Location: Right Arm)   Pulse (!) 114   Temp 98.1 F (36.7 C) (Oral)   Resp 17   SpO2 94%  Gen:   Awake, no distress   Resp:  Normal effort  MSK:   Moves extremities without difficulty  Other:    Medical Decision Making  Medically screening exam initiated at 5:36 PM.  Appropriate orders placed.  [name] was informed that the remainder of the evaluation will be completed by another provider, this initial triage assessment does not replace that evaluation, and the importance of remaining in the ED until their evaluation is complete.     Lucina Sabal, PA-C 08/30/23 1737

## 2023-08-30 NOTE — ED Notes (Signed)
 Patient and support person given water and other drinks. Notified by MD that they will need to be NPO after midnight.

## 2023-08-30 NOTE — ED Provider Notes (Cosign Needed)
 [place] EMERGENCY DEPARTMENT AT [facility] Provider Note  History  Chief Complaint:  Shortness of Breath   Shortness of Breath    Brian Mclean is a 38 y.o. male with a history of hypertension hypercholesterolemia who presents the emergency department for shortness of breath.  He was having a stress test today when he became hypotensive during exercise portion.  Patient also had worsening shortness of breath.  He reports that he did not have chest pain.  Reports that his legs are at his baseline swollen status.  He reports that he has had URI symptoms over the past several days.  No fevers.  No chills.    On review of the stress test today patient did develop hypotension during study.  Patient also had concerning findings for right-sided elevated pressures on the echo portion of the study.  They thought this was a high risk feature and should report to the emergency department further workup.  Past Medical History:  Diagnosis Date   Benign hypertension    High blood pressure    Hypercholesteremia     Past Surgical History:  Procedure Laterality Date   VENTRAL HERNIA REPAIR N/A 07/24/2022   Procedure: OPEN HERNIA REPAIR VENTRAL ADULT WITH MESH;  Surgeon: Sim Dryer, MD;  Location: [facility] OR;  Service: General;  Laterality: N/A;    Family History  Problem Relation Age of Onset   Hypertension Mother    Congestive Heart Failure Mother    Diabetes Father    Blindness Father    Hypertension Maternal Grandmother    Congestive Heart Failure Maternal Grandmother    Hypertension Maternal Grandfather    Congestive Heart Failure Maternal Grandfather    COPD Maternal Grandfather     Social History   Tobacco Use   Smoking status: Some Days    Current packs/day: 0.00    Types: Cigarettes    Last attempt to quit: 12/27/2016    Years since quitting: 6.6   Smokeless tobacco: Never  Vaping Use   Vaping status: Some Days  Substance Use Topics   Alcohol use: Yes     Comment: socially   Drug use: Yes    Types: Marijuana    Review of Systems  Review of Systems  Respiratory:  Positive for shortness of breath.       Reviewed and documented in HPI if pertinent.   Physical Exam   ED Triage Vitals [08/30/23 1718]  Encounter Vitals Group     BP (!) 152/100     Systolic BP Percentile      Diastolic BP Percentile      Pulse Rate (!) 114     Resp 17     Temp 98.1 F (36.7 C)     Temp Source Oral     SpO2 94 %     Weight      Height      Head Circumference      Peak Flow      Pain Score      Pain Loc      Pain Education      Exclude from Growth Chart      Physical Exam Vitals and nursing note reviewed.  Constitutional:      General: He is not in acute distress.    Appearance: He is well-developed. He is ill-appearing. He is not toxic-appearing.  HENT:     Head: Normocephalic and atraumatic.  Eyes:     Conjunctiva/sclera: Conjunctivae normal.  Cardiovascular:  Rate and Rhythm: Normal rate and regular rhythm.     Heart sounds: No murmur heard. Pulmonary:     Effort: Pulmonary effort is normal. No respiratory distress.     Breath sounds: Wheezing and rhonchi present.  Abdominal:     Palpations: Abdomen is soft.     Tenderness: There is no abdominal tenderness.  Musculoskeletal:        General: No swelling.     Cervical back: Neck supple.     Right lower leg: Edema present.     Left lower leg: Edema present.  Skin:    General: Skin is warm and dry.     Capillary Refill: Capillary refill takes less than 2 seconds.  Neurological:     Mental Status: He is alert.  Psychiatric:        Mood and Affect: Mood normal.      Procedures   Procedures  ED Course - Medical Decision Making  Brief Overview Awais SILVIANO CATANIA is a 38 y.o. male who presents as per above.  I have reviewed the nursing documentation for past medical history, family history, and social history and agree.  I have reviewed the patient's vital signs.  HTN.  Initial Differential Diagnoses: I am primarily concerned for reactive airway disease, PE, pneumonia, electrolyte abnormalities, ACS.  Therapies: These medications and interventions were provided for the patient while in the ED.  Medications  aspirin chewable tablet 324 mg (has no administration in time range)  hydrochlorothiazide  (HYDRODIURIL ) tablet 25 mg (has no administration in time range)  labetalol  (NORMODYNE ) tablet 200 mg (has no administration in time range)  losartan  (COZAAR ) tablet 50 mg (has no administration in time range)  enoxaparin  (LOVENOX ) injection 40 mg (has no administration in time range)  sodium chloride  flush (NS) 0.9 % injection 3 mL (has no administration in time range)  potassium chloride SA (KLOR-CON M) CR tablet 20 mEq (has no administration in time range)  magnesium sulfate IVPB 1 g 100 mL (has no administration in time range)  acetaminophen  (TYLENOL ) tablet 650 mg (has no administration in time range)    Or  acetaminophen  (TYLENOL ) suppository 650 mg (has no administration in time range)  oxyCODONE  (Oxy IR/ROXICODONE ) immediate release tablet 5 mg (has no administration in time range)  senna-docusate (Senokot-S) tablet 1 tablet (has no administration in time range)  ondansetron  (ZOFRAN ) tablet 4 mg (has no administration in time range)    Or  ondansetron  (ZOFRAN ) injection 4 mg (has no administration in time range)  ipratropium-albuterol  (DUONEB) 0.5-2.5 (3) MG/3ML nebulizer solution 3 mL (has no administration in time range)  ipratropium-albuterol  (DUONEB) 0.5-2.5 (3) MG/3ML nebulizer solution 3 mL (3 mLs Nebulization Given 08/30/23 2051)  iohexol  (OMNIPAQUE ) 350 MG/ML injection 75 mL (75 mLs Intravenous Contrast Given 08/30/23 2025)    Testing Results: On my interpretation labs are significant for : Elevated troponin No significant electrolyte abnormalities   On my interpretation imaging is significant for: No pneumonia or thromboembolic disease  identified  I interpreted the ECG. It reveals a sinus rhythm.  Rightward facing axis.  S1, Q3 T3 identified which is indicative of right sided strain.  The QTc, PR, and QRS are appropriate. There are no signs of acute ischemia or of significant electrical abnormalities. The ECG does not show a STEMI. There are no ST depressions. There are no T wave inversions. There is no evidence of a High-Grade Conduction Block, WPW, Brugada Sign, ARVC, DeWinters T Waves, or Wellens Waves.  See the EMR for  full details regarding lab and imaging results.   Medical Decision Making 38 year old male who presents the emergency department from stress echo lab.  Patient developed hypotension and severe dyspnea when attempting to ambulate.  Patient also had septal bowing with concern for elevated right sided pressures therefore sent to the emergency department for further evaluation.  On exam patient does have lower extremity edema.  He does have rhonchi and wheezing throughout his lung fields.  He does have evidence of URI on exam.  Abdomen soft and nontender.  Concern is for the elevated right sided pressures therefore we will investigate with a CTA PE protocol.  This was negative for thromboembolic disease.  Patient's lungs do not appear to have pneumonia.  Troponin was elevated which could be from stress from earlier echocardiogram.  Aspirin was given.  I did discuss with cardiology who feels that the patient should be admitted to hospitalist with cardiology consult for potential invasive investigation.  Patient was given some DuoNeb treatment here in the emergency department given his wheezing on exam.  Patient had minimal relief of his symptoms.  I suspect this is chronic wheezing.  Discussed with the hospitalist and he is agreeable with admission at this time.  Amount and/or Complexity of Data Reviewed Labs: ordered. Radiology: ordered.  Risk Prescription drug management. Decision regarding  hospitalization.     ### All radiography studies, electrocardiograms, and laboratory data were personally reviewed by me and incorporated into my medical decision making. Impression   1. Shortness of breath      Note: Dragon medical dictation software was used in the creation of this note.     Arminda Landmark, MD 08/30/23 2252

## 2023-08-30 NOTE — Progress Notes (Signed)
 Plan of care discussed w/ Amjad Ali PA-C over the phone.  In addition to there recommendation - I recommended ruling out PE, EKG, trend troponin, UDS. Based on the results will discuss disposition planning.   Kayelee Herbig [place], [place], [facility]  5:37 PM 08/30/23

## 2023-08-30 NOTE — ED Notes (Signed)
 EDP in pt room to [facility] at this time

## 2023-08-30 NOTE — ED Provider Notes (Signed)
 I saw and evaluated the patient, reviewed the resident's note and I agree with the findings and plan.   ED ECG REPORT   Date: 08/30/2023  Rate: 115  Rhythm: sinus tachycardia  QRS Axis: normal  Intervals: normal  ST/T Wave abnormalities: nonspecific ST changes  Conduction Disutrbances:none  Narrative Interpretation:   Old EKG Reviewed: none available  I have personally reviewed the EKG tracing and agree with the computerized printout as noted.  38 year old male presents presents with shortness of breath.  This occurred while he was have an echo.  Apparently has some bowing during the exam.  Sent here for further evaluation.  Plan will be for patient to get admitted by cardiology       Lind Repine, MD 08/30/23 [id number]

## 2023-08-30 NOTE — Progress Notes (Signed)
 Patient was advised to seek medical attention by going to ED due to abnormal stress test - to rule out blood clot and heart disease. He refused EMS transfer. But since he was still not in the ED I called him twice to check up on him but call goes to voicemail (Tried twice).   Brian Dibiasio Minneiska, DO, [facility]  5:07 PM 08/30/23

## 2023-08-30 NOTE — ED Triage Notes (Addendum)
 PT sent from cardiology for chest pain workup. Pt was having a stress test and got very winded while on the treadmill. PT complains of cough and SOB x 2 days. Denies pain or fever. BLE swelling x 3 days.

## 2023-08-31 ENCOUNTER — Telehealth: Payer: Self-pay | Admitting: Physician Assistant

## 2023-08-31 DIAGNOSIS — R9439 Abnormal result of other cardiovascular function study: Secondary | ICD-10-CM | POA: Diagnosis not present

## 2023-08-31 LAB — BASIC METABOLIC PANEL WITH GFR
Anion gap: 13 (ref 5–15)
BUN: 14 mg/dL (ref 6–20)
CO2: 27 mmol/L (ref 22–32)
Calcium: 9.4 mg/dL (ref 8.9–10.3)
Chloride: 99 mmol/L (ref 98–111)
Creatinine, Ser: 1.22 mg/dL (ref 0.61–1.24)
GFR, Estimated: >60 mL/min (ref >60)
Glucose, Bld: 135 mg/dL — ABNORMAL HIGH (ref 70–99)
Potassium: 3.8 mmol/L (ref 3.5–5.1)
Sodium: 139 mmol/L (ref 135–145)

## 2023-08-31 LAB — CBC
HCT: 44.1 % (ref 39.0–52.0)
Hemoglobin: 14.3 g/dL (ref 13.0–17.0)
MCH: 26 pg (ref 26.0–34.0)
MCHC: 32.4 g/dL (ref 30.0–36.0)
MCV: 80.3 fL (ref 80.0–100.0)
Platelets: 215 10*3/uL (ref 150–400)
RBC: 5.49 MIL/uL (ref 4.22–5.81)
RDW: 14.3 % (ref 11.5–15.5)
WBC: 7.8 10*3/uL (ref 4.0–10.5)
nRBC: 0 % (ref 0.0–0.2)

## 2023-08-31 LAB — MAGNESIUM: Magnesium: 2.1 mg/dL (ref 1.7–2.4)

## 2023-08-31 LAB — TROPONIN I (HIGH SENSITIVITY): Troponin I (High Sensitivity): 52 ng/L — ABNORMAL HIGH (ref <18)

## 2023-08-31 LAB — HIV ANTIBODY (ROUTINE TESTING W REFLEX): HIV Screen 4th Generation wRfx: NONREACTIVE

## 2023-08-31 MED ORDER — ISOSORBIDE MONONITRATE ER 30 MG PO TB24: 15.0000 mg | ORAL_TABLET | Freq: Every day | ORAL | Status: DC

## 2023-08-31 NOTE — Discharge Summary (Signed)
 Physician Discharge Summary [facility] discharge)   Patient: Brian Mclean MRN: [id number] DOB: [birth year]  Admit date:     08/30/2023  Discharge date: 08/31/23  Discharge Physician: MDALA-GAUSI, Jilda Most   PCP: Melodie Spry, NP   Discharge Diagnoses: Principal Problem:   Abnormal stress test Active Problems:   Primary hypertension   Chronic bronchitis with wheezing (HCC)   Hypokalemia  Resolved Problems:   * No resolved hospital problems. *  Hospital Course: 38 y.o. man with PMH of hypertension, hyperlipidemia, chronic bronchitis, and BMI 52 who presented at the direction of his cardiologist for further evaluation of abnormal stress test.   Patient had been referred to cardiology for evaluation of chest discomfort and shortness of breath with activities.  He underwent echocardiogram stress test on 5/9, developed hypotension during exercise, and had RV apical hypokinesis that resolved with stress.  He was directed to the ED for further evaluation and management.  The patient was admitted to the hospitalist service and cardiology was consulted.  Cardiology noted that patient would need a cardiac catheterization, and planned to have this done on 5/12.  The patient, however, was unhappy that the catheterization could not be done on 5/10 and decided to leave AMA.  I did not see the patient because he left before I started my rounds.      Consultants: Cardiology Procedures performed: n/a  Disposition: Home (left AGAINST MEDICAL ADVICE)   DISCHARGE MEDICATION: N/A (left AGAINST MEDICAL ADVICE) Allergies as of 08/31/2023       Reactions   Penicillins Swelling   Has patient had a PCN reaction causing immediate rash, facial/tongue/throat swelling, SOB or lightheadedness with hypotension: Unknown Has patient had a PCN reaction causing severe rash involving mucus membranes or skin necrosis: Unknown Has patient had a PCN reaction that required hospitalization: Unknown Has patient had a  PCN reaction occurring within the last 10 years: No If all of the above answers are "NO", then may proceed with Cephalosporin use.            Discharge Exam: Filed Weights   08/30/23 2300  Weight: (!) 163.3 kg      The results of significant diagnostics from this hospitalization (including imaging, microbiology, ancillary and laboratory) are listed below for reference.   Imaging Studies: CT Angio Chest PE W and/or Wo Contrast Result Date: 08/30/2023 CLINICAL DATA:  Shortness of breath. EXAM: CT ANGIOGRAPHY CHEST WITH CONTRAST TECHNIQUE: Multidetector CT imaging of the chest was performed using the standard protocol during bolus administration of intravenous contrast. Multiplanar CT image reconstructions and MIPs were obtained to evaluate the vascular anatomy. RADIATION DOSE REDUCTION: This exam was performed according to the departmental dose-optimization program which includes automated exposure control, adjustment of the mA and/or kV according to patient size and/or use of iterative reconstruction technique. CONTRAST:  75mL OMNIPAQUE  IOHEXOL  350 MG/ML SOLN COMPARISON:  None Available. FINDINGS: Cardiovascular: The thoracic aorta is unremarkable. The subsegmental pulmonary arteries are limited in evaluation secondary to suboptimal opacification with intravenous contrast. No evidence of pulmonary embolism. Normal heart size. No pericardial effusion. Mediastinum/Nodes: No enlarged mediastinal, hilar, or axillary lymph nodes. Thyroid gland, trachea, and esophagus demonstrate no significant findings. Lungs/Pleura: Lungs are clear. No pleural effusion or pneumothorax. Upper Abdomen: No acute abnormality. Musculoskeletal: No chest wall abnormality. No acute or significant osseous findings. Review of the MIP images confirms the above findings. IMPRESSION: No evidence of pulmonary embolism or acute cardiopulmonary disease. Electronically Signed   By: Virgle Grime M.D.   On:  08/30/2023 22:02    DG Chest 2 View Result Date: 08/30/2023 CLINICAL DATA:  Shortness of breath EXAM: CHEST - 2 VIEW COMPARISON:  Chest radiograph 01/09/2012 FINDINGS: The heart size and mediastinal contours are within normal limits. Both lungs are clear. The visualized skeletal structures are unremarkable. IMPRESSION: No active cardiopulmonary disease. Electronically Signed   By: Jone Neither M.D.   On: 08/30/2023 18:31   ECHOCARDIOGRAM STRESS TEST Result Date: 08/30/2023     EXERCISE STRESS ECHO REPORT    -------------------------------------------------------------------------------- Patient Name:   Brian Mclean Date of Exam: 08/30/2023 Medical Rec #:  [id number]       Height:       70.0 in Accession #:    [id number]      Weight:       363.8 lb Date of Birth:  02-Jan-1986       BSA:          2.692 m Patient Age:    38 years        BP:           137/92 mmHg Patient Gender: M               HR:           99 bpm. Exam Location:  [address] Procedure: Stress Echo and Intracardiac Opacification Agent Indications:    R07.2 Precordial pain  History:        Patient has prior history of Echocardiogram examinations, most                 recent 08/20/2023.  Sonographer:    Joleen Navy RDCS Referring Phys: [id number] SUNIT TOLIA IMPRESSIONS  1. This is an inconclusive stress echocardiogram for ischemia.  2. Prior to the study, no wall motion abnormalities.  3. Patient was not able to exercise for significant amount of time- met heart rate only on ECG portion of test; this is electrically negative for ischemia but inconclusive for ischemic by echo.  4. During the stress portion, exercise induced hypotension (a risk feature). D Shaped septum seen in systole. RV apical hypokinesis that resolves with stress. Differential is related to RV strain (Pulmonary embolism, exercise induced pulmonary hypertension). Frequent, polymorphic PVCs with couplets and bigeminy.  5. This is a high risk study.  6. Discussed with outpatient team; patient will  be evaluated as per protocol. FINDINGS Exam Protocol: The patient exercised on a treadmill according to a Bruce protocol. Definity contrast agent was given IV to delineate the left ventricular endocardial borders.  Patient Performance: The patient exercised for 3 minutes achieving 4.6 METS. The maximum stage achieved was I of the Bruce protocol. The heart rate at peak stress was 160 bpm. The target heart rate was calculated to be 155 bpm. The percentage of maximum predicted heart rate achieved was 87.5 %. The baseline blood pressure was 137/92 mmHg. The blood pressure at peak stress was 95/58 mmHg. The patient developed fatigue and shortness of breath during the stress exam.  EKG: Resting EKG showed normal sinus rhythm. The patient developed frequent premature ventricular contractions and frequent PVCs, multifocal with couplets and bigeminy during exercise.  2D Echo Findings: The baseline ejection fraction was 60%. The peak ejection fraction at stress was 80%. Baseline regional wall motion abnormalities were not present. This is an inconclusive stress echocardiogram for ischemia.  Gloriann Larger MD Electronically signed on 08/30/2023 at 3:40:28 PM    Final    ECHOCARDIOGRAM COMPLETE Result Date: 08/20/2023    ECHOCARDIOGRAM  REPORT   Patient Name:   Brian Mclean Date of Exam: 08/20/2023 Medical Rec #:  [id number]       Height:       70.0 in Accession #:    [id number]      Weight:       363.8 lb Date of Birth:  15-Jul-1985       BSA:          2.692 m Patient Age:    38 years        BP:           160/90 mmHg Patient Gender: M               HR:           74 bpm. Exam Location:  [address] Procedure: 2D Echo, 3D Echo, Cardiac Doppler and Color Doppler (Both Spectral            and Color Flow Doppler were utilized during procedure). Indications:    R07.2 Precordial Pain  History:        Patient has no prior history of Echocardiogram examinations.                 Abnormal ECG, Signs/Symptoms:Chest Pain and  Shortness of Breath;                 Risk Factors:Current Smoker, Dyslipidemia, Hypertension and                 Family History of Coronary Artery Disease. Obesity.  Sonographer:    Ewing Holiday RDCS Referring Phys: SUNIT TOLIA IMPRESSIONS  1. Prominent apical chords/trabeculations. Left ventricular ejection fraction, by estimation, is 50%. The left ventricle has low normal function. The left ventricle has no regional wall motion abnormalities. There is mild left ventricular hypertrophy. Left ventricular diastolic parameters are indeterminate.  2. Right ventricular systolic function is normal. The right ventricular size is normal. Tricuspid regurgitation signal is inadequate for assessing PA pressure.  3. The mitral valve is normal in structure. Trivial mitral valve regurgitation. No evidence of mitral stenosis.  4. The aortic valve is tricuspid. Aortic valve regurgitation is not visualized. No aortic stenosis is present.  5. The inferior vena cava is normal in size with greater than 50% respiratory variability, suggesting right atrial pressure of 3 mmHg. FINDINGS  Left Ventricle: Prominent apical chords/trabeculations. Left ventricular ejection fraction, by estimation, is 50%. The left ventricle has low normal function. The left ventricle has no regional wall motion abnormalities. The left ventricular internal cavity size was normal in size. There is mild left ventricular hypertrophy. Left ventricular diastolic parameters are indeterminate. Right Ventricle: The right ventricular size is normal. No increase in right ventricular wall thickness. Right ventricular systolic function is normal. Tricuspid regurgitation signal is inadequate for assessing PA pressure. Left Atrium: Left atrial size was normal in size. Right Atrium: Right atrial size was normal in size. Pericardium: Trivial pericardial effusion is present. Mitral Valve: The mitral valve is normal in structure. Trivial mitral valve regurgitation. No  evidence of mitral valve stenosis. Tricuspid Valve: The tricuspid valve is normal in structure. Tricuspid valve regurgitation is trivial. No evidence of tricuspid stenosis. Aortic Valve: The aortic valve is tricuspid. Aortic valve regurgitation is not visualized. No aortic stenosis is present. Pulmonic Valve: The pulmonic valve was normal in structure. Pulmonic valve regurgitation is trivial. No evidence of pulmonic stenosis. Aorta: The aortic root is normal in size and structure. Venous: The inferior vena cava is normal  in size with greater than 50% respiratory variability, suggesting right atrial pressure of 3 mmHg. IAS/Shunts: No atrial level shunt detected by color flow Doppler. Additional Comments: 3D was performed not requiring image post processing on an independent workstation and was abnormal.  LEFT VENTRICLE PLAX 2D LVIDd:         4.50 cm   Diastology LVIDs:         3.30 cm   LV e' medial:    8.05 cm/s LV PW:         1.20 cm   LV E/e' medial:  7.1 LV IVS:        1.10 cm   LV e' lateral:   16.20 cm/s LVOT diam:     2.40 cm   LV E/e' lateral: 3.5 LV SV:         59 LV SV Index:   22 LVOT Area:     4.52 cm                           3D Volume EF:                          3D EF:        48 %                          LV EDV:       125 ml                          LV ESV:       65 ml                          LV SV:        60 ml RIGHT VENTRICLE RV Basal diam:  4.30 cm RV Mid diam:    3.20 cm RV S prime:     12.60 cm/s TAPSE (M-mode): 2.5 cm LEFT ATRIUM             Index        RIGHT ATRIUM           Index LA diam:        4.40 cm 1.63 cm/m   RA Pressure: 3.00 mmHg LA Vol (A2C):   64.0 ml 23.78 ml/m  RA Area:     15.50 cm LA Vol (A4C):   83.9 ml 31.17 ml/m  RA Volume:   42.80 ml  15.90 ml/m LA Biplane Vol: 75.4 ml 28.01 ml/m  AORTIC VALVE LVOT Vmax:   68.55 cm/s LVOT Vmean:  46.050 cm/s LVOT VTI:    0.130 m  AORTA Ao Root diam: 3.20 cm Ao Asc diam:  3.00 cm MITRAL VALVE               TRICUSPID VALVE MV Area  (PHT): cm         Estimated RAP:  3.00 mmHg MV Decel Time: 250 msec MV E velocity: 57.15 cm/s  SHUNTS MV A velocity: 71.35 cm/s  Systemic VTI:  0.13 m MV E/A ratio:  0.80        Systemic Diam: 2.40 cm Grady Lawman MD Electronically signed by Grady Lawman MD Signature Date/Time: 08/20/2023/9:45:24 PM    Final     Microbiology: Results for orders placed or performed during the hospital encounter of  08/30/23  Resp panel by RT-PCR (RSV, Flu A&B, Covid) Anterior Nasal Swab     Status: None   Collection Time: 08/30/23  8:10 PM   Specimen: Anterior Nasal Swab  Result Value Ref Range Status   SARS Coronavirus 2 by RT PCR NEGATIVE NEGATIVE Final   Influenza A by PCR NEGATIVE NEGATIVE Final   Influenza B by PCR NEGATIVE NEGATIVE Final    Comment: (NOTE) The Xpert Xpress SARS-CoV-2/FLU/RSV plus assay is intended as an aid in the diagnosis of influenza from Nasopharyngeal swab specimens and should not be used as a sole basis for treatment. Nasal washings and aspirates are unacceptable for Xpert Xpress SARS-CoV-2/FLU/RSV testing.  Fact Sheet for Patients: BloggerCourse.com  Fact Sheet for Healthcare Providers: SeriousBroker.it  This test is not yet approved or cleared by the [place]  FDA and has been authorized for detection and/or diagnosis of SARS-CoV-2 by FDA under an Emergency Use Authorization (EUA). This EUA will remain in effect (meaning this test can be used) for the duration of the COVID-19 declaration under Section 564(b)(1) of the Act, 21 U.S.C. section 360bbb-3(b)(1), unless the authorization is terminated or revoked.     Resp Syncytial Virus by PCR NEGATIVE NEGATIVE Final    Comment: (NOTE) Fact Sheet for Patients: BloggerCourse.com  Fact Sheet for Healthcare Providers: SeriousBroker.it  This test is not yet approved or cleared by the [place]  FDA and has been  authorized for detection and/or diagnosis of SARS-CoV-2 by FDA under an Emergency Use Authorization (EUA). This EUA will remain in effect (meaning this test can be used) for the duration of the COVID-19 declaration under Section 564(b)(1) of the Act, 21 U.S.C. section 360bbb-3(b)(1), unless the authorization is terminated or revoked.  Performed at [facility], [address], [place], [place] [address]     Labs: CBC: Recent Labs  Lab 08/30/23 1719 08/31/23 0214  WBC 7.3 7.8  HGB 14.9 14.3  HCT 45.3 44.1  MCV 79.9* 80.3  PLT 215 215   Basic Metabolic Panel: Recent Labs  Lab 08/30/23 1719 08/31/23 0214  NA 139 139  K 3.4* 3.8  CL 99 99  CO2 29 27  GLUCOSE 127* 135*  BUN 12 14  CREATININE 1.24 1.22  CALCIUM  9.6 9.4  MG 1.7 2.1   Liver Function Tests: No results for input(s): "AST", "ALT", "ALKPHOS", "BILITOT", "PROT", "ALBUMIN" in the last 168 hours. CBG: No results for input(s): "GLUCAP" in the last 168 hours.  Discharge time spent: less than 30 minutes.  Signed: MDALA-GAUSI, Kanan Sobek AGATHA, MD [facility] 08/31/2023

## 2023-08-31 NOTE — ED Notes (Addendum)
 Patient having moments of apnea while asleep. SPO2 saturations fell to 78%, Placed on 2L via Nasal cannula and HOB elvated to 30 degrees, SPO2 saturations increased to 100%. Family member at bedside call light within reach.

## 2023-08-31 NOTE — Progress Notes (Signed)
 Patient seen and examined.  Agree with above documentation.  Mr. Brian Mclean is a 38 year old male with history of hypertension, hyperlipidemia who presents with exertional chest pain and shortness of breath.  Underwent stress echo yesterday, with high risk study with developed exercise-induced hypotension and frequent polymorphic PVCs in the RV hypokinesis.  Resting echocardiogram showed prominent trabeculations, EF 50%, normal RV function, no significant valvular disease.  Underwent CTPA on presentation to ED which showed no PE.  Troponin 47 > 52.  Agree with plan per Dr. Ossie Blend, will plan for [facility] on Monday.  If normal, plan for cardiac MRI.  I discussed this with patient, and he became very angry that cath was not going to be done today and that he will leave and have done as outpatient.  Recommend staying for cath on Monday but not sure he will agree to this.  Called CT to see if could get coronary CTA on weekend, but not able to this weekend (and likely not a good coronary CTA candidate given his BMI).   If patient opts for discharge,  would discharge with ASA, statin, imdur 15 mg daily, as needed SL nitroglycerin and will plan close outpatient f/u.  Wendie Hamburg, MD

## 2023-08-31 NOTE — Telephone Encounter (Addendum)
   Dr. Alda Amas reached out to notify that patient left AMA today (details outlined in progress note from earlier today) as they were angry that cath was not going to be done today over the weekend. Patient left AMA before any prescriptions or instructions could be discussed. Dr. Alda Amas requests to arrange close follow-up in office this week to help guide next steps for meds/cath. I will forward to [facility] scheduling/nursing team. Will cc Dr. Albert Huff as Arlie Lain as well.  Shazia Mitchener N Kaula Klenke, PA-C

## 2023-08-31 NOTE — Telephone Encounter (Signed)
 I am in the hospital this coming week and vacation the following week.  I would not wait for a follow up me - either see APP or DOD and schedule a left and right heart cath w/ possible intervention given he high risk stress echocardiogram.   Lonni Robert - please make sure this is taken care off.   Thank you everyone.   Brian Mclean [place], DO, [facility]

## 2023-08-31 NOTE — ED Notes (Addendum)
 PT chose to leave AMA.   He and his significant other were very upset after speaking with cardiology and hearing the cath would not happen until Monday. They expressed frustration with their visit in general to include being moved within the dept. Twice since arriving.  I attempted to deescalate and explain the AMA process but the pt was insistent on leaving and that he would follow up with his doctor on Monday.    Admitting doctor was notified.

## 2023-08-31 NOTE — ED Notes (Incomplete)
 As noted in cardiology note, pt and family became very upset when told they would not be cathed until Monday.  They have expressed frustration with their entire stay to include being moved twice since their arrival.  They stated that they felt the cardiologist was rude and that we are wasting their time.

## 2023-08-31 NOTE — Consult Note (Signed)
 Cardiology Consultation:   Patient ID: Brian Mclean MRN: [id number]; DOB: March 22, 1986  Admit date: 08/30/2023 Date of Consult: 08/31/2023  Primary Care Provider: Melodie Spry, NP Primary Cardiologist: Olinda Bertrand, DO  Primary Electrophysiologist:  None    Patient Profile:   Brian Mclean is a 38 y.o. male with a hx of HTN, HLD, chronic bronchitis, and obesity who is being seen today for the evaluation of abnormal stress test that was a high risk finding.  History of Present Illness:   Mr. Brian Mclean is a 38 y.o. male who has hypertension, hyperlipidemia, chronic bronchitis, and obesity who was evaluated as an outpatient for chest discomfort and shortness of breath with exertion.  He underwent echo stress testing on 08/30/2023 and during the test developed hypotension and had RV apical hypokinesis that resolved with stress.  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  As such, his outpatient cardiologist referred him for admission.  In the emergency department, he is hemodynamically stable.  He is afebrile and breathing comfortably on room air.  With mild tachycardia on ECG and slightly elevated blood pressures.  Labs were largely unremarkable and he had elevated but flat troponin.     Past Medical History:  Diagnosis Date   Benign hypertension    High blood pressure    Hypercholesteremia      Past Surgical History:  Procedure Laterality Date   VENTRAL HERNIA REPAIR N/A 07/24/2022   Procedure: OPEN HERNIA REPAIR VENTRAL ADULT WITH MESH;  Surgeon: Sim Dryer, MD;  Location: [facility] OR;  Service: General;  Laterality: N/A;     Home Medications:  Prior to Admission medications   Medication Sig Start Date End Date Taking? Authorizing Provider  acetaminophen  (TYLENOL ) 500 MG tablet Take 1,000 mg by mouth 2 (two) times daily as needed for moderate pain.   Yes [provider]  Albuterol -Budesonide (AIRSUPRA ) 90-80 MCG/ACT AERO Inhale 2 puffs into the lungs  every 4 (four) hours as needed. 06/12/23  Yes Melodie Spry, NP  hydrochlorothiazide  (HYDRODIURIL ) 25 MG tablet Take 1 tablet (25 mg total) by mouth daily. 06/12/23  Yes Melodie Spry, NP  labetalol  (NORMODYNE ) 200 MG tablet Take 1 tablet (200 mg total) by mouth 2 (two) times daily. 07/16/23  Yes Tolia, Sunit, DO  losartan  (COZAAR ) 50 MG tablet Take 1 tablet (50 mg total) by mouth daily. 07/16/23  Yes Tolia, Sunit, DO  atorvastatin  (LIPITOR) 40 MG tablet Take 1 tablet (40 mg total) by mouth daily. Patient not taking: Reported on 07/16/2023 06/19/23 06/18/24  Melodie Spry, NP  benzonatate  (TESSALON  PERLES) 100 MG capsule Take 1 capsule (100 mg total) by mouth 3 (three) times daily as needed for cough. Patient not taking: Reported on 08/30/2023 06/12/23 06/11/24  Melodie Spry, NP  nicotine  (NICODERM CQ ) 14 mg/24hr patch Place 1 patch (14 mg total) onto the skin daily. Patient not taking: Reported on 08/30/2023 06/12/23   Melodie Spry, NP  Semaglutide -Weight Management (WEGOVY ) 0.25 MG/0.5ML SOAJ Inject 0.25 mg into the skin every 7 (seven) days. Patient not taking: Reported on 08/30/2023 03/07/23   Melodie Spry, NP  albuterol  (PROVENTIL  HFA;VENTOLIN  HFA) 108 (90 Base) MCG/ACT inhaler Inhale 1-2 puffs into the lungs every 6 (six) hours as needed for wheezing or shortness of breath. 01/21/18 02/15/20  Landa Pine, FNP    Inpatient Medications: Scheduled Meds:  aspirin  324 mg Oral Once   enoxaparin  (LOVENOX ) injection  80 mg Subcutaneous Q24H   hydrochlorothiazide   25 mg Oral Daily  labetalol   200 mg Oral BID   losartan   50 mg Oral Daily   potassium chloride  20 mEq Oral Once   sodium chloride  flush  3 mL Intravenous Q12H   Continuous Infusions:  magnesium sulfate bolus IVPB     PRN Meds: acetaminophen  **OR** acetaminophen , ipratropium-albuterol , ondansetron  **OR** ondansetron  (ZOFRAN ) IV, oxyCODONE , senna-docusate  Allergies:    Allergies  Allergen Reactions   Penicillins Swelling    Has patient had  a PCN reaction causing immediate rash, facial/tongue/throat swelling, SOB or lightheadedness with hypotension: Unknown Has patient had a PCN reaction causing severe rash involving mucus membranes or skin necrosis: Unknown Has patient had a PCN reaction that required hospitalization: Unknown Has patient had a PCN reaction occurring within the last 10 years: No If all of the above answers are "NO", then may proceed with Cephalosporin use.     Social History:   Social History   Socioeconomic History   Marital status: Single    Spouse name: Not on file   Number of children: Not on file   Years of education: Not on file   Highest education level: Not on file  Occupational History   Not on file  Tobacco Use   Smoking status: Some Days    Current packs/day: 0.00    Types: Cigarettes    Last attempt to quit: 12/27/2016    Years since quitting: 6.6   Smokeless tobacco: Never  Vaping Use   Vaping status: Some Days  Substance and Sexual Activity   Alcohol use: Yes    Comment: socially   Drug use: Yes    Types: Marijuana   Sexual activity: Not on file  Other Topics Concern   Not on file  Social History Narrative   Not on file   Social Drivers of Health   Financial Resource Strain: Not on file  Food Insecurity: Food Insecurity Present (12/15/2022)   Hunger Vital Sign    Worried About Running Out of Food in the Last Year: Sometimes true    Ran Out of Food in the Last Year: Sometimes true  Transportation Needs: No Transportation Needs (12/20/2022)   PRAPARE - Administrator, Civil Service (Medical): No    Lack of Transportation (Non-Medical): No  Physical Activity: Not on file  Stress: Not on file  Social Connections: Not on file  Intimate Partner Violence: Not At Risk (12/20/2022)   Humiliation, Afraid, Rape, and Kick questionnaire    Fear of Current or Ex-Partner: No    Emotionally Abused: No    Physically Abused: No    Sexually Abused: No    Family History:     Family History  Problem Relation Age of Onset   Hypertension Mother    Congestive Heart Failure Mother    Diabetes Father    Blindness Father    Hypertension Maternal Grandmother    Congestive Heart Failure Maternal Grandmother    Hypertension Maternal Grandfather    Congestive Heart Failure Maternal Grandfather    COPD Maternal Grandfather      Review of Systems: All other review of systems are negative unless otherwise noted in the HPI as above.   Physical Exam/Data:   Vitals:   08/30/23 1718 08/30/23 2047 08/30/23 2108 08/30/23 2300  BP: (!) 152/100  (!) 174/120   Pulse: (!) 114  85   Resp: 17  16   Temp: 98.1 F (36.7 C)  98.1 F (36.7 C)   TempSrc: Oral  Oral   SpO2: 94%  98% 99%   Weight:    (!) 163.3 kg  Height:    5\' 10"  (1.778 m)   No intake or output data in the 24 hours ending 08/31/23 0001 Filed Weights   08/30/23 2300  Weight: (!) 163.3 kg   Body mass index is 51.65 kg/m.  General appearance: alert, cooperative, and morbidly obese Heart: regular rate and rhythm, S1, S2 normal, no murmur, click, rub or gallop Abdomen: soft, non-tender; bowel sounds normal; no masses,  no organomegaly Neurologic: Grossly normal  EKG:  The EKG was personally reviewed and demonstrates: Sinus tachycardia Telemetry:  Telemetry was personally reviewed and demonstrates: Sinus rhythm  Relevant CV Studies: Echo stress:   1. This is an inconclusive stress echocardiogram for ischemia.   2. Prior to the study, no wall motion abnormalities.   3. Patient was not able to exercise for significant amount of time- met  heart rate only on ECG portion of test; this is electrically negative for  ischemia but inconclusive for ischemic by echo.   4. During the stress portion, exercise induced hypotension (a risk  feature). D Shaped septum seen in systole. RV apical hypokinesis that  resolves with stress. Differential is related to RV strain (Pulmonary  embolism, exercise induced  pulmonary  hypertension). Frequent, polymorphic PVCs with couplets and bigeminy.   5. This is a high risk study.   6. Discussed with outpatient team; patient will be evaluated as per  protocol.   Laboratory Data:  Chemistry Recent Labs  Lab 08/30/23 1719  NA 139  K 3.4*  CL 99  CO2 29  GLUCOSE 127*  BUN 12  CREATININE 1.24  CALCIUM  9.6  GFRNONAA >60  ANIONGAP 11    No results for input(s): "PROT", "ALBUMIN", "AST", "ALT", "ALKPHOS", "BILITOT" in the last 168 hours. Hematology Recent Labs  Lab 08/30/23 1719  WBC 7.3  RBC 5.67  HGB 14.9  HCT 45.3  MCV 79.9*  MCH 26.3  MCHC 32.9  RDW 14.4  PLT 215   Cardiac EnzymesNo results for input(s): "TROPONINI" in the last 168 hours. No results for input(s): "TROPIPOC" in the last 168 hours.  BNP Recent Labs  Lab 08/30/23 1719  BNP 38.0    DDimer No results for input(s): "DDIMER" in the last 168 hours.  Radiology/Studies:  CT Angio Chest PE W and/or Wo Contrast Result Date: 08/30/2023 CLINICAL DATA:  Shortness of breath. EXAM: CT ANGIOGRAPHY CHEST WITH CONTRAST TECHNIQUE: Multidetector CT imaging of the chest was performed using the standard protocol during bolus administration of intravenous contrast. Multiplanar CT image reconstructions and MIPs were obtained to evaluate the vascular anatomy. RADIATION DOSE REDUCTION: This exam was performed according to the departmental dose-optimization program which includes automated exposure control, adjustment of the mA and/or kV according to patient size and/or use of iterative reconstruction technique. CONTRAST:  75mL OMNIPAQUE  IOHEXOL  350 MG/ML SOLN COMPARISON:  None Available. FINDINGS: Cardiovascular: The thoracic aorta is unremarkable. The subsegmental pulmonary arteries are limited in evaluation secondary to suboptimal opacification with intravenous contrast. No evidence of pulmonary embolism. Normal heart size. No pericardial effusion. Mediastinum/Nodes: No enlarged mediastinal,  hilar, or axillary lymph nodes. Thyroid gland, trachea, and esophagus demonstrate no significant findings. Lungs/Pleura: Lungs are clear. No pleural effusion or pneumothorax. Upper Abdomen: No acute abnormality. Musculoskeletal: No chest wall abnormality. No acute or significant osseous findings. Review of the MIP images confirms the above findings. IMPRESSION: No evidence of pulmonary embolism or acute cardiopulmonary disease. Electronically Signed   By: Virgle Grime  M.D.   On: 08/30/2023 22:02   DG Chest 2 View Result Date: 08/30/2023 CLINICAL DATA:  Shortness of breath EXAM: CHEST - 2 VIEW COMPARISON:  Chest radiograph 01/09/2012 FINDINGS: The heart size and mediastinal contours are within normal limits. Both lungs are clear. The visualized skeletal structures are unremarkable. IMPRESSION: No active cardiopulmonary disease. Electronically Signed   By: Jone Neither M.D.   On: 08/30/2023 18:31   ECHOCARDIOGRAM STRESS TEST Result Date: 08/30/2023     EXERCISE STRESS ECHO REPORT    -------------------------------------------------------------------------------- Patient Name:   QUINCI Brian Mclean Date of Exam: 08/30/2023 Medical Rec #:  [id number]       Height:       70.0 in Accession #:    [id number]      Weight:       363.8 lb Date of Birth:  [birth year]       BSA:          2.692 m Patient Age:    38 years        BP:           137/92 mmHg Patient Gender: M               HR:           99 bpm. Exam Location:  [address] Procedure: Stress Echo and Intracardiac Opacification Agent Indications:    R07.2 Precordial pain  History:        Patient has prior history of Echocardiogram examinations, most                 recent 08/20/2023.  Sonographer:    Joleen Navy RDCS Referring Phys: [id number] SUNIT TOLIA IMPRESSIONS  1. This is an inconclusive stress echocardiogram for ischemia.  2. Prior to the study, no wall motion abnormalities.  3. Patient was not able to exercise for significant amount of time- met heart rate  only on ECG portion of test; this is electrically negative for ischemia but inconclusive for ischemic by echo.  4. During the stress portion, exercise induced hypotension (a risk feature). D Shaped septum seen in systole. RV apical hypokinesis that resolves with stress. Differential is related to RV strain (Pulmonary embolism, exercise induced pulmonary hypertension). Frequent, polymorphic PVCs with couplets and bigeminy.  5. This is a high risk study.  6. Discussed with outpatient team; patient will be evaluated as per protocol. FINDINGS Exam Protocol: The patient exercised on a treadmill according to a Bruce protocol. Definity contrast agent was given IV to delineate the left ventricular endocardial borders.  Patient Performance: The patient exercised for 3 minutes achieving 4.6 METS. The maximum stage achieved was I of the Bruce protocol. The heart rate at peak stress was 160 bpm. The target heart rate was calculated to be 155 bpm. The percentage of maximum predicted heart rate achieved was 87.5 %. The baseline blood pressure was 137/92 mmHg. The blood pressure at peak stress was 95/58 mmHg. The patient developed fatigue and shortness of breath during the stress exam.  EKG: Resting EKG showed normal sinus rhythm. The patient developed frequent premature ventricular contractions and frequent PVCs, multifocal with couplets and bigeminy during exercise.  2D Echo Findings: The baseline ejection fraction was 60%. The peak ejection fraction at stress was 80%. Baseline regional wall motion abnormalities were not present. This is an inconclusive stress echocardiogram for ischemia.  Gloriann Larger MD Electronically signed on 08/30/2023 at 3:40:28 PM    Final     Assessment and Plan:  DOE with abnormal stress testing No chest discomfort or shortness of breath at rest.  Admitted for ongoing workup given high risk features of stress testing.  Will need cardiac catheterization as part of workup and if those are  normal then he should likely undergo cardiac MRI.  Troponin initially was slightly elevated however repeat was flat so likely at chronic myocardial injury and not ACS.  No need for anticoagulation.  Recommendations: - Will need to undergo coronary angiography - If cath is normal should consider cardiac MRI given abnormalities found on echo      For questions or updates, please contact [facility] Please consult [email or web address] for contact info under     Signed, Bert Britain, MD  08/31/2023 12:01 AM

## 2023-08-31 NOTE — ED Notes (Signed)
 PT moved to [address].  PT not wearing 02 consistently.  Still desatting while at rest but rebounding.  Suspect OSA.  Pt provided ice per orders and wife given a recliner for comfort.

## 2023-09-02 ENCOUNTER — Telehealth: Payer: Self-pay | Admitting: Cardiology

## 2023-09-02 NOTE — Telephone Encounter (Signed)
 Patient had a stress test on 05/09 and was sent to the ED. He ended up needing a cath but left because he had 5 kids at home that he needed to get to. He is now wanting to schedule a cath procedure, please advise

## 2023-09-02 NOTE — Telephone Encounter (Signed)
 Spoke with pt regarding getting set up for a cath. From a prior encounter thread Dr. Albert Huff stated on 5/10 to proceed with R & L heart cath. Pt was scheduled to see Jerilynn Montenegro 5/14. Pt verbalized understanding. All questions if any were answered.

## 2023-09-02 NOTE — Telephone Encounter (Signed)
 Refer to telephone encounter from 5/12- pt set up for appt with Sharren Decree, PA-C on 5/14.

## 2023-09-02 NOTE — Progress Notes (Deleted)
 Cardiology Office Note:    Date:  09/02/2023   ID:  Brian Mclean, DOB [birth year], MRN [id number]  PCP:  Brian Spry, NP  Cardiologist:  Brian Bertrand, DO { Click to update primary MD,subspecialty MD or APP then REFRESH:1}    Referring MD: Brian Spry, NP   Chief Complaint: follow-up of abnormal stress test  History of Present Illness:    Brian Mclean is a 38 y.o. male with a history of chest pain with recent abnormal stress test,  hypertension, hyperlipidemia, obesity with BMI of 52, tobacco use, and marijuana use who is followed by Dr. Albert Mclean and presents today for follow-up of abnormal stress test.  Patient was recently referred to Dr. Albert Mclean in 06/2023 for further evaluation of chest pain and dyspnea on exertion over the last couple of weeks. Echo and stress Echo were ordered for further evaluation. BP was noted to be elevated and there was concern for secondary to hypertension. Aldosterone: renin ratio, TSH, and cortisol were all negative negative. Urine metanephrines were elevated but plasma metanephrines were normal. Protein: creatinine ration was elevated. Echo showed LVEF of 50% with normal wall motion, mild LVH, and prominent apical chords/ trabeculations as well as normal RV function and no significant valvular disease. Stress Echo on 08/30/2023 was high risk with exercise induced hypotension and RV apical hypokinesis that resolves with stress. It was felt to be inconclusive for ischemia. He was advised to go to the ED for further evaluation. High-sensitivity troponin was mildly elevated and flat at 47 >> 52. He was seen by Cardiology and plan was for cardiac catheterization but patient left AMA.   He presents today for follow-up. ***  Chest Pain Abnormal Stress Test At office visit in 06/2023, patient reported exertional chest pain and dyspnea. Stress Echo was ordered and was high risk. He was advised to go to the ED after this and troponin was mildly elevated and flat. Plan was  for cardiac catheterization but patient left AMA.  - *** - Continue high-intensity statin. - Will start Aspirin 81mg  daily.  - Will provide sublingual Nitroglycerin.  - Will arrange outpatient right /left cardiac catheterization per Dr. Albert Mclean.  The patient understands that risks include but are not limited to stroke (1 in 1000), death (1 in 1000), kidney failure [usually temporary] (1 in 500), bleeding (1 in 200), allergic reaction [possibly serious] (1 in 200), and agrees to proceed.   Hypertension BP well controlled. *** - Continue Losartan  50mg  daily and Labetalol  200mg  twice daily.   Hyperlipidemia Lipid panel in 05/2023: Total Cholesterol 299, Triglycerides 207, HDL 45, LDL 214.  - He was started on Lipitor 40mg  daily at time of last lab check. Will increase to 80mg  daily.  - Will recheck lipid panel and LFTs. ***  Obesity BMI 51.65.  - ***  Tobacco Abuse ***  EKGs/Labs/Other Studies Reviewed:    The following studies were reviewed:  Echocardiogram 08/20/2023: Impressions: 1. Prominent apical chords/trabeculations. Left ventricular ejection  fraction, by estimation, is 50%. The left ventricle has low normal  function. The left ventricle has no regional wall motion abnormalities.  There is mild left ventricular hypertrophy.  Left ventricular diastolic parameters are indeterminate.   2. Right ventricular systolic function is normal. The right ventricular  size is normal. Tricuspid regurgitation signal is inadequate for assessing  PA pressure.   3. The mitral valve is normal in structure. Trivial mitral valve  regurgitation. No evidence of mitral stenosis.   4. The aortic valve is  tricuspid. Aortic valve regurgitation is not  visualized. No aortic stenosis is present.   5. The inferior vena cava is normal in size with greater than 50%  respiratory variability, suggesting right atrial pressure of 3 mmHg.  _______________  Stress Echocardiogram 08/30/2023: Impressions: 1.  This is an inconclusive stress echocardiogram for ischemia.   2. Prior to the study, no wall motion abnormalities.   3. Patient was not able to exercise for significant amount of time- met  heart rate only on ECG portion of test; this is electrically negative for  ischemia but inconclusive for ischemic by echo.   4. During the stress portion, exercise induced hypotension (a risk  feature). D Shaped septum seen in systole. RV apical hypokinesis that  resolves with stress. Differential is related to RV strain (Pulmonary  embolism, exercise induced pulmonary  hypertension). Frequent, polymorphic PVCs with couplets and bigeminy.   5. This is a high risk study.   6. Discussed with outpatient team; patient will be evaluated as per  protocol.    EKG:  EKG not ordered today.   Recent Labs: 06/14/2023: ALT 34 07/17/2023: TSH 0.509 08/30/2023: B Natriuretic Peptide 38.0 08/31/2023: BUN 14; Creatinine, Ser 1.22; Hemoglobin 14.3; Magnesium 2.1; Platelets 215; Potassium 3.8; Sodium 139  Recent Lipid Panel    Component Value Date/Time   CHOL 299 (H) 06/14/2023 1038   TRIG 207 (H) 06/14/2023 1038   HDL 45 06/14/2023 1038   CHOLHDL 6.6 (H) 06/14/2023 1038   LDLCALC 214 (H) 06/14/2023 1038    Physical Exam:    Vital Signs: There were no vitals taken for this visit.    Wt Readings from Last 3 Encounters:  08/30/23 (!) 360 lb (163.3 kg)  07/16/23 (!) 363 lb 12.8 oz (165 kg)  06/14/23 (!) 345 lb (156.5 kg)     General: 38 y.o. male in no acute distress. HEENT: Normocephalic and atraumatic. Sclera clear.  Neck: Supple. No carotid bruits. No JVD. Heart: *** RRR. Distinct S1 and S2. No murmurs, gallops, or rubs.  Lungs: No increased work of breathing. Clear to ausculation bilaterally. No wheezes, rhonchi, or rales.  Abdomen: Soft, non-distended, and non-tender to palpation.  Extremities: No lower extremity edema.  Radial and distal pedal pulses 2+ and equal bilaterally. Skin: Warm and  dry. Neuro: No focal deficits. Psych: Normal affect. Responds appropriately.   Assessment:    No diagnosis found.  Plan:     Disposition: Follow up in ***   Signed, [name]  09/02/2023 3:33 PM    [facility]

## 2023-09-04 ENCOUNTER — Ambulatory Visit: Attending: Medical | Admitting: Medical

## 2023-09-04 ENCOUNTER — Ambulatory Visit: Admitting: Student

## 2023-09-04 VITALS — BP 123/80 | HR 94 | Ht 71.0 in | Wt 351.0 lb

## 2023-09-04 DIAGNOSIS — F1210 Cannabis abuse, uncomplicated: Secondary | ICD-10-CM

## 2023-09-04 DIAGNOSIS — R0609 Other forms of dyspnea: Secondary | ICD-10-CM | POA: Diagnosis not present

## 2023-09-04 DIAGNOSIS — I10 Essential (primary) hypertension: Secondary | ICD-10-CM

## 2023-09-04 DIAGNOSIS — R9439 Abnormal result of other cardiovascular function study: Secondary | ICD-10-CM | POA: Diagnosis not present

## 2023-09-04 DIAGNOSIS — F17210 Nicotine dependence, cigarettes, uncomplicated: Secondary | ICD-10-CM

## 2023-09-04 DIAGNOSIS — E7800 Pure hypercholesterolemia, unspecified: Secondary | ICD-10-CM

## 2023-09-04 MED ORDER — ASPIRIN 81 MG PO TBEC: 81.0000 mg | DELAYED_RELEASE_TABLET | Freq: Every day | ORAL | 3 refills | Status: AC

## 2023-09-04 NOTE — Progress Notes (Signed)
 Cardiology Office Note:  .   Date:  09/04/2023  ID:  [name], DOB 1985-10-10, MRN [id number] PCP: Melodie Spry, NP  [facility] Providers Cardiologist:  Olinda Bertrand, DO {  History of Present Illness: .   Brian Mclean is a 38 y.o. male with a history of hypertension, tobacco use, hyperlipidemia, chronic bronchitis, and obesity is being seen for hospital follow-up.  The patient underwent echo stress testing on 08/30/2023 for DOE/chest pain, and during the test developed hypotension and had RV apical hypokinesis that resolved with stress.  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  As such, his outpatient cardiologist referred him for admission.  In the ER he was hemodynamically stable.  He had an elevated troponin with flat trend. Plan was for outpatient LHC. The patient was not happy the cath could be down over the weekend and he left AMA on 08/31/23.   Today, the patient reports he has been feelign OK. He denies chest pain. Feels like he has a head cold and minor SOB.  LHC discussed and the patient is agreeable to this. He is down to smoking 1-2 cigarettes daily. No alcohol use. He smokes marijuana every day.   Studies Reviewed: Aaron Aas   EKG Interpretation Date/Time:  Wednesday Sep 04 2023 13:44:01 EDT Ventricular Rate:  94 PR Interval:  138 QRS Duration:  92 QT Interval:  372 QTC Calculation: 465 R Axis:   88  Text Interpretation: Normal sinus rhythm Nonspecific T wave abnormality Prolonged QT When compared with ECG of 30-Aug-2023 17:20, No significant change was found Confirmed by Gennaro Khat, Aahil Fredin ([id number]) on 09/04/2023 1:50:12 PM    Echo stress test 08/30/23 1. This is an inconclusive stress echocardiogram for ischemia.   2. Prior to the study, no wall motion abnormalities.   3. Patient was not able to exercise for significant amount of time- met  heart rate only on ECG portion of test; this is electrically negative for  ischemia but inconclusive for  ischemic by echo.   4. During the stress portion, exercise induced hypotension (a risk  feature). D Shaped septum seen in systole. RV apical hypokinesis that  resolves with stress. Differential is related to RV strain (Pulmonary  embolism, exercise induced pulmonary  hypertension). Frequent, polymorphic PVCs with couplets and bigeminy.   5. This is a high risk study.   6. Discussed with outpatient team; patient will be evaluated as per  protocol.    Echo 08/20/2023 1. Prominent apical chords/trabeculations. Left ventricular ejection  fraction, by estimation, is 50%. The left ventricle has low normal  function. The left ventricle has no regional wall motion abnormalities.  There is mild left ventricular hypertrophy.  Left ventricular diastolic parameters are indeterminate.   2. Right ventricular systolic function is normal. The right ventricular  size is normal. Tricuspid regurgitation signal is inadequate for assessing  PA pressure.   3. The mitral valve is normal in structure. Trivial mitral valve  regurgitation. No evidence of mitral stenosis.   4. The aortic valve is tricuspid. Aortic valve regurgitation is not  visualized. No aortic stenosis is present.   5. The inferior vena cava is normal in size with greater than 50%  respiratory variability, suggesting right atrial pressure of 3 mmHg.           Physical Exam:   VS:  BP 123/80 (BP Location: Left Arm)   Pulse 94   Ht 5\' 11"  (1.803 m)   Wt (!) 351 lb (  159.2 kg)   SpO2 93%   BMI 48.95 kg/m    Wt Readings from Last 3 Encounters:  09/04/23 (!) 351 lb (159.2 kg)  08/30/23 (!) 360 lb (163.3 kg)  07/16/23 (!) 363 lb 12.8 oz (165 kg)    GEN: Well nourished, well developed in no acute distress NECK: No JVD; No carotid bruits CARDIAC: RRR, no murmurs, rubs, gallops RESPIRATORY:  Clear to auscultation without rales, wheezing or rhonchi  ABDOMEN: Soft, non-tender, non-distended EXTREMITIES:  No edema; No deformity    ASSESSMENT AND PLAN: .    Abnormal stress echo DOE/chest pain The patient underwent stress echo 08/30/2023 for DOE/ chest pain and developed exercise-induced hypotension and frequent polymorphic PVCs.  Resting echocardiogram showed prominent trabeculations, EF 50%, normal RV function, no significant valvular disease.  He underwent chest CTA which showed no PE.  High-sensitivity troponin 47, 52.  Plan was for heart cath on Monday, but patient was sent home with plan for outpatient workup.  Patient denies any chest pain.  He reports minimal shortness of breath from head cold.  He is agreeable to a left heart cath, he would like to pursue care here in [place].  If this is normal, plan for cardiac MRI. He was started on Lipitor 40mg  daily and Losartan  50mg  daily. I will start ASA mg daily.   HTN BP well controlled, continue hydrochlorothiazide  and Losartan .   HLD LDL 214, total chol 299, TG 207, HDL 45. Continue  Lipitor 40mg  daily. Can re-check lipids at follow-up.   Tobacco use Marijuana use Patient smokes 1 to 2 cigarettes daily, complete cessation recommended.  Patient smokes marijuana daily, recommended for cessation as well.     Informed Consent   Shared Decision Making/Informed Consent The risks [stroke (1 in 1000), death (1 in 1000), kidney failure [usually temporary] (1 in 500), bleeding (1 in 200), allergic reaction [possibly serious] (1 in 200)], benefits (diagnostic support and management of coronary artery disease) and alternatives of a cardiac catheterization were discussed in detail with [name] and he is willing to proceed.     Dispo: Follow-up in 3 weeks  Signed, Meaghann Choo Rebekah Canada, PA-C

## 2023-09-04 NOTE — Patient Instructions (Signed)
 Medication Instructions:  Your physician recommends the following medication changes.  START TAKING: Aspirin 81 mg daily  *If you need a refill on your cardiac medications before your next appointment, please call your pharmacy*  Lab Work: No labs ordered today  If you have labs (blood work) drawn today and your tests are completely normal, you will receive your results only by: MyChart Message (if you have MyChart) OR A paper copy in the mail If you have any lab test that is abnormal or we need to change your treatment, we will call you to review the results.  Testing/Procedures: Left Heart Cath   [facility] A DEPT OF [facility] AT [facility] [address] [place] [address] Dept: [phone] Loc: [phone]  NELL ECKERSON  09/04/2023  You are scheduled for a Cardiac Catheterization on Wednesday, May 21 with Dr. Antionette Kirks.  1. Please arrive at the [facility] Entrance of [facility], [address], [place] [address] at 8:30 AM (This is 1 hour(s) prior to your procedure time).  Proceed to the Check-In Desk directly inside the entrance.  Procedure Parking: Use the entrance off of the [address] side of the hospital. Turn right upon entering and follow the driveway to parking that is directly in front of the Heart & Vascular Center. There is no valet parking available at this entrance, however there is an awning directly in front of the Heart & Vascular Center for drop off/ pick up for patients.  Special note: Every effort is made to have your procedure done on time. Please understand that emergencies sometimes delay scheduled procedures.  2. Diet: Do not eat solid foods after midnight.  The patient may have clear liquids until 5am upon the day of the procedure.  3. 4. Medication instructions in preparation for your procedure:  Hold hydrochlorothiazide  the morning of  procedure.  May take all other medications with small sip of water  Contrast Allergy: No    Current Outpatient Medications (Cardiovascular):    atorvastatin  (LIPITOR) 40 MG tablet, Take 1 tablet (40 mg total) by mouth daily.   hydrochlorothiazide  (HYDRODIURIL ) 25 MG tablet, Take 1 tablet (25 mg total) by mouth daily.   labetalol  (NORMODYNE ) 200 MG tablet, Take 1 tablet (200 mg total) by mouth 2 (two) times daily.   losartan  (COZAAR ) 50 MG tablet, Take 1 tablet (50 mg total) by mouth daily.  Current Outpatient Medications (Respiratory):    Albuterol -Budesonide (AIRSUPRA ) 90-80 MCG/ACT AERO, Inhale 2 puffs into the lungs every 4 (four) hours as needed.   benzonatate  (TESSALON  PERLES) 100 MG capsule, Take 1 capsule (100 mg total) by mouth 3 (three) times daily as needed for cough.  Current Outpatient Medications (Analgesics):    acetaminophen  (TYLENOL ) 500 MG tablet, Take 1,000 mg by mouth 2 (two) times daily as needed for moderate pain.   aspirin EC 81 MG tablet, Take 1 tablet (81 mg total) by mouth daily. Swallow whole.   Current Outpatient Medications (Other):    nicotine  (NICODERM CQ ) 14 mg/24hr patch, Place 1 patch (14 mg total) onto the skin daily. *For reference purposes while preparing patient instructions.   Delete this med list prior to printing instructions for patient.*      On the morning of your procedure, take your Aspirin 81 mg and any morning medicines NOT listed above.  You may use sips of water.  5. Plan to go home the same day, you  will only stay overnight if medically necessary. 6. Bring a current list of your medications and current insurance cards. 7. You MUST have a responsible person to drive you home. 8. Someone MUST be with you the first 24 hours after you arrive home or your discharge will be delayed. 9. Please wear clothes that are easy to get on and off and wear slip-on shoes.  Thank you for allowing us  to care for you!   -- [facility]   Follow-Up: At [facility], you and your health needs are our priority.  As part of our continuing mission to provide you with exceptional heart care, our providers are all part of one team.  This team includes your primary Cardiologist (physician) and Advanced Practice Providers or APPs (Physician Assistants and Nurse Practitioners) who all work together to provide you with the care you need, when you need it.  Your next appointment:   3 week(s)  Provider:   Toribio Frees, PA-C

## 2023-09-04 NOTE — H&P (View-Only) (Signed)
 Cardiology Office Note:  .   Date:  09/04/2023  ID:  Brian Mclean, DOB 1985-10-10, MRN [id number] PCP: Melodie Spry, NP  [facility] Providers Cardiologist:  Olinda Bertrand, DO {  History of Present Illness: .   Brian Mclean is a 38 y.o. male with a history of hypertension, tobacco use, hyperlipidemia, chronic bronchitis, and obesity is being seen for hospital follow-up.  The patient underwent echo stress testing on 08/30/2023 for DOE/chest pain, and during the test developed hypotension and had RV apical hypokinesis that resolved with stress.  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  As such, his outpatient cardiologist referred him for admission.  In the ER he was hemodynamically stable.  He had an elevated troponin with flat trend. Plan was for outpatient LHC. The patient was not happy the cath could be down over the weekend and he left AMA on 08/31/23.   Today, the patient reports he has been feelign OK. He denies chest pain. Feels like he has a head cold and minor SOB.  LHC discussed and the patient is agreeable to this. He is down to smoking 1-2 cigarettes daily. No alcohol use. He smokes marijuana every day.   Studies Reviewed: Aaron Aas   EKG Interpretation Date/Time:  Wednesday Sep 04 2023 13:44:01 EDT Ventricular Rate:  94 PR Interval:  138 QRS Duration:  92 QT Interval:  372 QTC Calculation: 465 R Axis:   88  Text Interpretation: Normal sinus rhythm Nonspecific T wave abnormality Prolonged QT When compared with ECG of 30-Aug-2023 17:20, No significant change was found Confirmed by Gennaro Khat, Aahil Fredin ([id number]) on 09/04/2023 1:50:12 PM    Echo stress test 08/30/23 1. This is an inconclusive stress echocardiogram for ischemia.   2. Prior to the study, no wall motion abnormalities.   3. Patient was not able to exercise for significant amount of time- met  heart rate only on ECG portion of test; this is electrically negative for  ischemia but inconclusive for  ischemic by echo.   4. During the stress portion, exercise induced hypotension (a risk  feature). D Shaped septum seen in systole. RV apical hypokinesis that  resolves with stress. Differential is related to RV strain (Pulmonary  embolism, exercise induced pulmonary  hypertension). Frequent, polymorphic PVCs with couplets and bigeminy.   5. This is a high risk study.   6. Discussed with outpatient team; patient will be evaluated as per  protocol.    Echo 08/20/2023 1. Prominent apical chords/trabeculations. Left ventricular ejection  fraction, by estimation, is 50%. The left ventricle has low normal  function. The left ventricle has no regional wall motion abnormalities.  There is mild left ventricular hypertrophy.  Left ventricular diastolic parameters are indeterminate.   2. Right ventricular systolic function is normal. The right ventricular  size is normal. Tricuspid regurgitation signal is inadequate for assessing  PA pressure.   3. The mitral valve is normal in structure. Trivial mitral valve  regurgitation. No evidence of mitral stenosis.   4. The aortic valve is tricuspid. Aortic valve regurgitation is not  visualized. No aortic stenosis is present.   5. The inferior vena cava is normal in size with greater than 50%  respiratory variability, suggesting right atrial pressure of 3 mmHg.           Physical Exam:   VS:  BP 123/80 (BP Location: Left Arm)   Pulse 94   Ht 5\' 11"  (1.803 m)   Wt (!) 351 lb (  159.2 kg)   SpO2 93%   BMI 48.95 kg/m    Wt Readings from Last 3 Encounters:  09/04/23 (!) 351 lb (159.2 kg)  08/30/23 (!) 360 lb (163.3 kg)  07/16/23 (!) 363 lb 12.8 oz (165 kg)    GEN: Well nourished, well developed in no acute distress NECK: No JVD; No carotid bruits CARDIAC: RRR, no murmurs, rubs, gallops RESPIRATORY:  Clear to auscultation without rales, wheezing or rhonchi  ABDOMEN: Soft, non-tender, non-distended EXTREMITIES:  No edema; No deformity    ASSESSMENT AND PLAN: .    Abnormal stress echo DOE/chest pain The patient underwent stress echo 08/30/2023 for DOE/ chest pain and developed exercise-induced hypotension and frequent polymorphic PVCs.  Resting echocardiogram showed prominent trabeculations, EF 50%, normal RV function, no significant valvular disease.  He underwent chest CTA which showed no PE.  High-sensitivity troponin 47, 52.  Plan was for heart cath on Monday, but patient was sent home with plan for outpatient workup.  Patient denies any chest pain.  He reports minimal shortness of breath from head cold.  He is agreeable to a left heart cath, he would like to pursue care here in [place].  If this is normal, plan for cardiac MRI. He was started on Lipitor 40mg  daily and Losartan  50mg  daily. I will start ASA mg daily.   HTN BP well controlled, continue hydrochlorothiazide  and Losartan .   HLD LDL 214, total chol 299, TG 207, HDL 45. Continue  Lipitor 40mg  daily. Can re-check lipids at follow-up.   Tobacco use Marijuana use Patient smokes 1 to 2 cigarettes daily, complete cessation recommended.  Patient smokes marijuana daily, recommended for cessation as well.     Informed Consent   Shared Decision Making/Informed Consent The risks [stroke (1 in 1000), death (1 in 1000), kidney failure [usually temporary] (1 in 500), bleeding (1 in 200), allergic reaction [possibly serious] (1 in 200)], benefits (diagnostic support and management of coronary artery disease) and alternatives of a cardiac catheterization were discussed in detail with [name] and he is willing to proceed.     Dispo: Follow-up in 3 weeks  Signed, Meaghann Choo Rebekah Canada, PA-C

## 2023-09-05 ENCOUNTER — Inpatient Hospital Stay: Admitting: Family Medicine

## 2023-09-06 ENCOUNTER — Telehealth: Payer: Self-pay

## 2023-09-06 NOTE — Telephone Encounter (Signed)
 Called and informed the patient that Dr. Alvenia Aus is scheduled to be at the [facility] cath lab on Wednesday, 09/11/23, instead of [facility]. The patient agreed to reschedule the cath to Friday, 09/13/23, at 9:30 AM, with an arrival time of 8:30 AM.  The cath lab has been notified.

## 2023-09-09 ENCOUNTER — Telehealth: Payer: Self-pay | Admitting: Medical

## 2023-09-09 NOTE — Telephone Encounter (Signed)
 Unable to leave voicemail, "call can not be completed at this time".

## 2023-09-09 NOTE — Telephone Encounter (Signed)
-----   Message from Nurse Dina Francisco A sent at 09/06/2023  5:04 PM EDT ----- Regarding: Appointment This patient needs 3 week follow up appointment. Just wanted to follow up on this. Thanks

## 2023-09-10 ENCOUNTER — Telehealth: Payer: Self-pay | Admitting: Medical

## 2023-09-10 NOTE — Telephone Encounter (Signed)
-----   Message from Nurse Dina Francisco A sent at 09/10/2023  9:42 AM EDT ----- Regarding: Appt Patient needs 3 week follow up from previous visit. Could someone please help with this?

## 2023-09-10 NOTE — Telephone Encounter (Signed)
 Called to schedule no answer, no voicemail

## 2023-09-11 ENCOUNTER — Ambulatory Visit: Payer: BC Managed Care – PPO | Admitting: Family Medicine

## 2023-09-11 DIAGNOSIS — R9439 Abnormal result of other cardiovascular function study: Secondary | ICD-10-CM

## 2023-09-13 ENCOUNTER — Encounter: Payer: Self-pay | Admitting: Cardiovascular Disease

## 2023-09-13 ENCOUNTER — Encounter
Admission: RE | Disposition: A | Discharge status: Home / Self Care | Payer: Self-pay | Source: Home / Self Care | Attending: Cardiovascular Disease

## 2023-09-13 ENCOUNTER — Other Ambulatory Visit: Payer: Self-pay

## 2023-09-13 ENCOUNTER — Ambulatory Visit
Admission: RE | Admit: 2023-09-13 | Discharge: 2023-09-13 | Disposition: A | Discharge status: Home / Self Care | Attending: Cardiovascular Disease | Admitting: Cardiovascular Disease

## 2023-09-13 DIAGNOSIS — I10 Essential (primary) hypertension: Secondary | ICD-10-CM | POA: Insufficient documentation

## 2023-09-13 DIAGNOSIS — R9439 Abnormal result of other cardiovascular function study: Secondary | ICD-10-CM | POA: Diagnosis not present

## 2023-09-13 DIAGNOSIS — I2510 Atherosclerotic heart disease of native coronary artery without angina pectoris: Secondary | ICD-10-CM | POA: Diagnosis not present

## 2023-09-13 DIAGNOSIS — R0609 Other forms of dyspnea: Secondary | ICD-10-CM | POA: Diagnosis not present

## 2023-09-13 DIAGNOSIS — F17210 Nicotine dependence, cigarettes, uncomplicated: Secondary | ICD-10-CM | POA: Diagnosis not present

## 2023-09-13 DIAGNOSIS — E785 Hyperlipidemia, unspecified: Secondary | ICD-10-CM | POA: Insufficient documentation

## 2023-09-13 DIAGNOSIS — Z79899 Other long term (current) drug therapy: Secondary | ICD-10-CM | POA: Insufficient documentation

## 2023-09-13 HISTORY — PX: LEFT HEART CATH AND CORONARY ANGIOGRAPHY: CATH118249

## 2023-09-13 SURGERY — LEFT HEART CATH AND CORONARY ANGIOGRAPHY
Anesthesia: Moderate Sedation | Laterality: Left

## 2023-09-13 MED ORDER — SODIUM CHLORIDE 0.9 % WEIGHT BASED INFUSION
3.0000 mL/kg/h | INTRAVENOUS | Status: AC
  Administered 2023-09-13: 3 mL/kg/h via INTRAVENOUS

## 2023-09-13 MED ORDER — MIDAZOLAM HCL 2 MG/2ML IJ SOLN
INTRAMUSCULAR | Status: AC
Start: 2023-09-13 — End: ?
  Filled 2023-09-13: qty 2

## 2023-09-13 MED ORDER — SODIUM CHLORIDE 0.9% FLUSH: 3.0000 mL | Freq: Two times a day (BID) | INTRAVENOUS | Status: DC

## 2023-09-13 MED ORDER — SODIUM CHLORIDE 0.9 % IV SOLN: INTRAVENOUS | Status: DC

## 2023-09-13 MED ORDER — VERAPAMIL HCL 2.5 MG/ML IV SOLN
INTRAVENOUS | Status: DC | PRN
  Administered 2023-09-13: 2.5 mg via INTRA_ARTERIAL

## 2023-09-13 MED ORDER — HEPARIN SODIUM (PORCINE) 1000 UNIT/ML IJ SOLN
INTRAMUSCULAR | Status: AC
  Filled 2023-09-13: qty 10

## 2023-09-13 MED ORDER — LIDOCAINE HCL 1 % IJ SOLN
INTRAMUSCULAR | Status: AC
  Filled 2023-09-13: qty 20

## 2023-09-13 MED ORDER — FENTANYL CITRATE (PF) 100 MCG/2ML IJ SOLN
INTRAMUSCULAR | Status: AC
  Filled 2023-09-13: qty 2

## 2023-09-13 MED ORDER — HEPARIN SODIUM (PORCINE) 1000 UNIT/ML IJ SOLN
INTRAMUSCULAR | Status: DC | PRN
  Administered 2023-09-13: 5000 [IU] via INTRAVENOUS

## 2023-09-13 MED ORDER — ASPIRIN 81 MG PO CHEW
CHEWABLE_TABLET | ORAL | Status: AC
  Filled 2023-09-13: qty 1

## 2023-09-13 MED ORDER — IOHEXOL 300 MG/ML  SOLN
INTRAMUSCULAR | Status: DC | PRN
Start: 2023-09-13 — End: 2023-09-13
  Administered 2023-09-13: 56 mL

## 2023-09-13 MED ORDER — ASPIRIN 81 MG PO CHEW
81.0000 mg | CHEWABLE_TABLET | ORAL | Status: AC
Start: 2023-09-14 — End: 2023-09-13
  Administered 2023-09-13: 81 mg via ORAL

## 2023-09-13 MED ORDER — SODIUM CHLORIDE 0.9 % IV SOLN: 250.0000 mL | INTRAVENOUS | Status: DC | PRN

## 2023-09-13 MED ORDER — ONDANSETRON HCL 4 MG/2ML IJ SOLN: 4.0000 mg | Freq: Four times a day (QID) | INTRAMUSCULAR | Status: DC | PRN

## 2023-09-13 MED ORDER — VERAPAMIL HCL 2.5 MG/ML IV SOLN
INTRAVENOUS | Status: AC
  Filled 2023-09-13: qty 2

## 2023-09-13 MED ORDER — HEPARIN (PORCINE) IN NACL 1000-0.9 UT/500ML-% IV SOLN
INTRAVENOUS | Status: DC | PRN
  Administered 2023-09-13: 1000 mL

## 2023-09-13 MED ORDER — ACETAMINOPHEN 325 MG PO TABS: 650.0000 mg | ORAL_TABLET | ORAL | Status: DC | PRN

## 2023-09-13 MED ORDER — FENTANYL CITRATE (PF) 100 MCG/2ML IJ SOLN
INTRAMUSCULAR | Status: DC | PRN
  Administered 2023-09-13: 50 ug via INTRAVENOUS

## 2023-09-13 MED ORDER — MIDAZOLAM HCL 2 MG/2ML IJ SOLN
INTRAMUSCULAR | Status: DC | PRN
  Administered 2023-09-13: 1 mg via INTRAVENOUS

## 2023-09-13 MED ORDER — SODIUM CHLORIDE 0.9% FLUSH: 3.0000 mL | INTRAVENOUS | Status: DC | PRN

## 2023-09-13 MED ORDER — LIDOCAINE HCL (PF) 1 % IJ SOLN
INTRAMUSCULAR | Status: DC | PRN
  Administered 2023-09-13: 2 mL

## 2023-09-13 MED ORDER — SODIUM CHLORIDE 0.9 % WEIGHT BASED INFUSION: 1.0000 mL/kg/h | INTRAVENOUS | Status: DC

## 2023-09-13 MED ORDER — HEPARIN (PORCINE) IN NACL 1000-0.9 UT/500ML-% IV SOLN
INTRAVENOUS | Status: AC
Start: 2023-09-13 — End: ?
  Filled 2023-09-13: qty 1000

## 2023-09-13 SURGICAL SUPPLY — 9 items
CATH INFINITI 5 FR JL3.5 (CATHETERS) IMPLANT
CATH INFINITI AMBI 5FR JK (CATHETERS) IMPLANT
DEVICE RAD COMP TR BAND LRG (VASCULAR PRODUCTS) IMPLANT
DRAPE BRACHIAL (DRAPES) IMPLANT
GLIDESHEATH SLEND SS 6F .021 (SHEATH) IMPLANT
GUIDEWIRE INQWIRE 1.5J.035X260 (WIRE) IMPLANT
PACK CARDIAC CATH (CUSTOM PROCEDURE TRAY) ×1 IMPLANT
SET ATX-X65L (MISCELLANEOUS) IMPLANT
STATION PROTECTION PRESSURIZED (MISCELLANEOUS) IMPLANT

## 2023-09-13 NOTE — Interval H&P Note (Signed)
 History and Physical Interval Note:  09/13/2023 9:46 AM  Brian Mclean  has presented today for surgery, with the diagnosis of L Cath   Abnormal stress test.  The various methods of treatment have been discussed with the patient and family. After consideration of risks, benefits and other options for treatment, the patient has consented to  Procedure(s): LEFT HEART CATH AND CORONARY ANGIOGRAPHY (Left) as a surgical intervention.  The patient's history has been reviewed, patient examined, no change in status, stable for surgery.  I have reviewed the patient's chart and labs.  Questions were answered to the patient's satisfaction.     Abrham Maslowski

## 2023-10-03 ENCOUNTER — Ambulatory Visit: Attending: Cardiology | Admitting: Cardiology

## 2023-10-08 ENCOUNTER — Ambulatory Visit: Attending: Medical | Admitting: Medical

## 2023-10-08 NOTE — Progress Notes (Deleted)
  Cardiology Office Note   Date:  10/08/2023  ID:  Brian Mclean, DOB [birth year], MRN [id number] PCP: Melodie Spry, NP  [facility] Providers Cardiologist:  Olinda Bertrand, DO { Click to update primary MD,subspecialty MD or APP then REFRESH:1}    History of Present Illness Brian Mclean is a 38 y.o. male with a history of hypertension, tobacco use, hyperlipidemia, chronic bronchitis, and obesity is being seen for hospital follow-up.   The patient underwent echo stress testing on 08/30/2023 for DOE/chest pain, and during the test developed hypotension and had RV apical hypokinesis that resolved with stress.  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  As such, his outpatient cardiologist referred him for admission.  In the ER he was hemodynamically stable.  He had an elevated troponin with flat trend. Plan was for outpatient LHC. The patient was not happy the cath could be down over the weekend and he left AMA on 08/31/23.   The patient was last seen 08/2023 and was set up for LHC. 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   Today,  cMRI   ROS: ***  Studies Reviewed      *** Risk Assessment/Calculations {Does this patient have ATRIAL FIBRILLATION?:[phone]} No BP recorded.  {Refresh Note OR Click here to enter BP  :1}***       Physical Exam VS:  There were no vitals taken for this visit.   Wt Readings from Last 3 Encounters:  09/13/23 (!) 355 lb 11.2 oz (161.3 kg)  09/04/23 (!) 351 lb (159.2 kg)  08/30/23 (!) 360 lb (163.3 kg)    GEN: Well nourished, well developed in no acute distress NECK: No JVD; No carotid bruits CARDIAC: ***RRR, no murmurs, rubs, gallops RESPIRATORY:  Clear to auscultation without rales, wheezing or rhonchi  ABDOMEN: Soft, non-tender, non-distended EXTREMITIES:  No edema; No deformity   ASSESSMENT AND PLAN ***    {Are you ordering a CV Procedure (e.g. stress test, cath, DCCV, TEE, etc)?    Press F2        :[id number]}  Dispo: ***  Signed, Eevie Lapp Rebekah Canada, PA-C

## 2023-10-21 ENCOUNTER — Ambulatory Visit: Admitting: Medical

## 2023-10-21 NOTE — Progress Notes (Deleted)
  Cardiology Office Note   Date:  10/21/2023  ID:  Brian Mclean, DOB [birth year], MRN [id number] PCP: Petrina Pries, NP  [facility] Providers Cardiologist:  Madonna Large, DO { Click to update primary MD,subspecialty MD or APP then REFRESH:1}    History of Present Illness Brian Mclean is a 38 y.o. male  with a history of hypertension, tobacco use, hyperlipidemia, chronic bronchitis, and obesity is being seen for hospital follow-up.   The patient underwent echo stress testing on 08/30/2023 for DOE/chest pain, and during the test developed hypotension and had RV apical hypokinesis that resolved with stress.  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  As such, his outpatient cardiologist referred him for admission.  In the ER he was hemodynamically stable.  He had an elevated troponin with flat trend. Plan was for outpatient LHC. The patient was not happy the cath could be down over the weekend and he left AMA on 08/31/23.   Patient was seen back in follow-up 09/04/2023 and was overall feeling okay.  He denied any chest pain.  Patient was set up for outpatient left heart cath.  Left heart cath 09/13/2023 showed torturous coronary arteries with moderate proximal RCA stenosis in a tortuous segment..  No obstructive CAD.  Mildly elevated LVEDP.  Recommended medical therapy and cardiac MRI.  Today, MRI  ROS: ***  Studies Reviewed      *** Risk Assessment/Calculations {Does this patient have ATRIAL FIBRILLATION?[phone]} No BP recorded.  {Refresh Note OR Click here to enter BP  :1}***       Physical Exam VS:  There were no vitals taken for this visit.       Wt Readings from Last 3 Encounters:  09/13/23 (!) 355 lb 11.2 oz (161.3 kg)  09/04/23 (!) 351 lb (159.2 kg)  08/30/23 (!) 360 lb (163.3 kg)    GEN: Well nourished, well developed in no acute distress NECK: No JVD; No carotid bruits CARDIAC: ***RRR, no murmurs, rubs, gallops RESPIRATORY:  Clear to  auscultation without rales, wheezing or rhonchi  ABDOMEN: Soft, non-tender, non-distended EXTREMITIES:  No edema; No deformity   ASSESSMENT AND PLAN ***    {Are you ordering a CV Procedure (e.g. stress test, cath, DCCV, TEE, etc)?   Press F2        :[id number]}  Dispo: ***  Signed, Froylan Hobby VEAR Fishman, PA-C

## 2023-11-19 ENCOUNTER — Ambulatory Visit: Admitting: Medical

## 2023-11-26 ENCOUNTER — Encounter: Payer: Self-pay | Admitting: Medical

## 2023-11-26 ENCOUNTER — Ambulatory Visit: Attending: Medical | Admitting: Medical

## 2023-11-26 VITALS — BP 140/80 | HR 76 | Ht 71.0 in | Wt 360.4 lb

## 2023-11-26 DIAGNOSIS — F17210 Nicotine dependence, cigarettes, uncomplicated: Secondary | ICD-10-CM

## 2023-11-26 DIAGNOSIS — Z79899 Other long term (current) drug therapy: Secondary | ICD-10-CM

## 2023-11-26 DIAGNOSIS — F1210 Cannabis abuse, uncomplicated: Secondary | ICD-10-CM

## 2023-11-26 DIAGNOSIS — R072 Precordial pain: Secondary | ICD-10-CM | POA: Diagnosis not present

## 2023-11-26 DIAGNOSIS — R9439 Abnormal result of other cardiovascular function study: Secondary | ICD-10-CM

## 2023-11-26 DIAGNOSIS — I10 Essential (primary) hypertension: Secondary | ICD-10-CM

## 2023-11-26 DIAGNOSIS — R0609 Other forms of dyspnea: Secondary | ICD-10-CM | POA: Diagnosis not present

## 2023-11-26 DIAGNOSIS — E782 Mixed hyperlipidemia: Secondary | ICD-10-CM

## 2023-11-26 NOTE — Patient Instructions (Signed)
 Medication Instructions:  Your physician recommends that you continue on your current medications as directed. Please refer to the Current Medication list given to you today.    *If you need a refill on your cardiac medications before your next appointment, please call your pharmacy*  Lab Work: Your provider would like for you to have following labs drawn today CBC, Lipid.     Testing/Procedures:   You are scheduled for Cardiac MRI at the location below.  Please arrive for your appointment at ______________ . ?   [facility] [address] [place], [place] [address] Please go to the [facility] and check-in with the desk attendant.   Magnetic resonance imaging (MRI) is a painless test that produces images of the inside of the body without using Xrays.  During an MRI, strong magnets and radio waves work together in a Data processing manager to form detailed images.   MRI images may provide more details about a medical condition than X-rays, CT scans, and ultrasounds can provide.  You may be given earphones to listen for instructions.  You may eat a light breakfast and take medications as ordered with the exception of furosemide, hydrochlorothiazide , chlorthalidone or spironolactone (or any other fluid pill). If you are undergoing a stress MRI, please avoid stimulants for 12 hr prior to test. (I.e. Caffeine, nicotine , chocolate, or antihistamine medications)  If your provider has ordered anti-anxiety medications for this test, then you will need a driver.  An IV will be inserted into one of your veins. Contrast material will be injected into your IV. It will leave your body through your urine within a day. You may be told to drink plenty of fluids to help flush the contrast material out of your system.  You will be asked to remove all metal, including: Watch, jewelry, and other metal objects including hearing aids, hair pieces and dentures. Also wearable glucose  monitoring systems (ie. Freestyle Libre and Omnipods) (Braces and fillings normally are not a problem.)   TEST WILL TAKE APPROXIMATELY 1 HOUR  PLEASE NOTIFY SCHEDULING AT LEAST 24 HOURS IN ADVANCE IF YOU ARE UNABLE TO KEEP YOUR APPOINTMENT. [phone]  For more information and frequently asked questions, please visit our website : [email or web address]  Please call the Cardiac Imaging Nurse Navigators with any questions/concerns. [phone] Office    Follow-Up: At [facility], you and your health needs are our priority.  As part of our continuing mission to provide you with exceptional heart care, our providers are all part of one team.  This team includes your primary Cardiologist (physician) and Advanced Practice Providers or APPs (Physician Assistants and Nurse Practitioners) who all work together to provide you with the care you need, when you need it.  Your next appointment:   3 month(s)  Provider:   Mikey Fishman, PA-C

## 2023-11-26 NOTE — Progress Notes (Signed)
 Cardiology Office Note   Date:  11/26/2023  ID:  Brian Mclean, DOB 1985-07-11, MRN [id number] PCP: Brian Pries, Brian Mclean   HeartCare Providers Cardiologist:  Brian Large, Brian Mclean     History of Present Illness Brian Mclean is a 38 y.o. male with a history of hypertension, tobacco use, hyperlipidemia, chronic bronchitis, and obesity is being seen for heart cath follow-up.   The patient underwent echo stress testing on 08/30/2023 for DOE/chest pain, and during the test developed hypotension and had RV apical hypokinesis that resolved with stress.  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  As such, his outpatient cardiologist referred him for admission.  In the ER he was hemodynamically stable.  He had an elevated troponin with flat trend. Plan was for outpatient LHC. The patient was not happy the cath could be down over the weekend and he left AMA on 08/31/23.   Patient was last seen 09/04/2023 and is feeling okay.  He denied any chest pain.  Patient was agreeable to pursue left heart cath.  Left heart cath on 09/13/2023 showed tortuous coronary arteries with moderate proximal RCA stenosis in a tortuous segment, no evidence of obstructive CAD.  LVEDP was mildly elevated.  Recommended medical therapy for nonobstructive CAD.  Today, the patient is overall doing well. He has occasional shortness of breath and chest pain. He is trying to Brian Mclean more walking, about 20 minutes at home. He is down to 1-2 cigarettes daily. He does have lower leg edema from dependent edema.   Studies Reviewed EKG Interpretation Date/Time:  Tuesday November 26 2023 10:17:03 EDT Ventricular Rate:  76 PR Interval:  148 QRS Duration:  94 QT Interval:  418 QTC Calculation: 470 R Axis:   78  Text Interpretation: Normal sinus rhythm Normal ECG When compared with ECG of 04-Sep-2023 13:44, Nonspecific T wave abnormality no longer evident in Anterolateral leads Confirmed by Franchester, Yale Golla ([id number]) on 11/26/2023  10:19:32 AM    Echo stress test 08/30/23 1. This is an inconclusive stress echocardiogram for ischemia.   2. Prior to the study, no wall motion abnormalities.   3. Patient was not able to exercise for significant amount of time- met  heart rate only on ECG portion of test; this is electrically negative for  ischemia but inconclusive for ischemic by echo.   4. During the stress portion, exercise induced hypotension (a risk  feature). D Shaped septum seen in systole. RV apical hypokinesis that  resolves with stress. Differential is related to RV strain (Pulmonary  embolism, exercise induced pulmonary  hypertension). Frequent, polymorphic PVCs with couplets and bigeminy.   5. This is a high risk study.   6. Discussed with outpatient team; patient will be evaluated as per  protocol.      Echo 08/20/2023 1. Prominent apical chords/trabeculations. Left ventricular ejection  fraction, by estimation, is 50%. The left ventricle has low normal  function. The left ventricle has no regional wall motion abnormalities.  There is mild left ventricular hypertrophy.  Left ventricular diastolic parameters are indeterminate.   2. Right ventricular systolic function is normal. The right ventricular  size is normal. Tricuspid regurgitation signal is inadequate for assessing  PA pressure.   3. The mitral valve is normal in structure. Trivial mitral valve  regurgitation. No evidence of mitral stenosis.   4. The aortic valve is tricuspid. Aortic valve regurgitation is not  visualized. No aortic stenosis is present.   5. The inferior vena cava is normal  in size with greater than 50%  respiratory variability, suggesting right atrial pressure of 3 mmHg.       Physical Exam VS:  BP (!) 140/80 (BP Location: Left Arm, Patient Position: Sitting, Cuff Size: Mclean)   Pulse 76   Ht 5' 11 (1.803 m)   Wt (!) 360 lb 6.4 oz (163.5 kg)   SpO2 96%   BMI 50.27 kg/m        Wt Readings from Last 3 Encounters:   11/26/23 (!) 360 lb 6.4 oz (163.5 kg)  09/13/23 (!) 355 lb 11.2 oz (161.3 kg)  09/04/23 (!) 351 lb (159.2 kg)    GEN: Well nourished, well developed in no acute distress NECK: No JVD; No carotid bruits CARDIAC: RRR, no murmurs, rubs, gallops RESPIRATORY:  Clear to auscultation without rales, wheezing or rhonchi  ABDOMEN: Soft, non-tender, non-distended EXTREMITIES:  No edema; No deformity   ASSESSMENT AND PLAN  Abnormal stress echo DOE/ chest pain The patient underwent stress echo 08/30/2023 for dyspnea on exertion and chest pain and developed exercise-induced hypotension and frequent polymorphic PVCs.  Resting echocardiogram showed prominent trabeculations, EF of 50%, normal RV function, no significant valvular disease.  Chest CTA showed no PE.  High-sensitivity troponin was 47 and 52.  Heart cath showed nonobstructive CAD.  Patient reports persistent and unchanged dyspnea on exertion and chest pain, which is likely factorial due to tobacco use, general deconditioning, obesity, HFpEF.  Patient reports he has been walking more.  He appears euvolemic on exam.  Patient is agreeable to cardiac MRI.    HTN BP is a little high today. He is trying to quit smoking. We will reevaluate at follow-up, if it is still high can increase losartan .  Continue hydrochlorothiazide  25 mg daily and losartan  50 mg daily.  HLD LDL 214, goal less than 70. Re-check lipids today. Continue Lipitor 40mg  daily.   Tobacco use Patient is down to smoking 1 to 2 cigarettes a day, he is wanting to quit.  Marijuana use He reports occasional marijuana use.       Dispo: follow-up in 3 months  Signed, Brian Bunda VEAR Fishman, PA-C

## 2023-11-27 ENCOUNTER — Ambulatory Visit: Payer: Self-pay | Admitting: Medical

## 2023-11-27 LAB — LIPID PANEL
Chol/HDL Ratio: 6.2 ratio — ABNORMAL HIGH (ref 0.0–5.0)
Cholesterol, Total: 262 mg/dL — ABNORMAL HIGH (ref 100–199)
HDL: 42 mg/dL (ref >39)
LDL Chol Calc (NIH): 197 mg/dL — ABNORMAL HIGH (ref 0–99)
Triglycerides: 126 mg/dL (ref 0–149)
VLDL Cholesterol Cal: 23 mg/dL (ref 5–40)

## 2023-11-27 LAB — HEMOGLOBIN AND HEMATOCRIT, BLOOD
Hematocrit: 46.5 % (ref 37.5–51.0)
Hemoglobin: 14.7 g/dL (ref 13.0–17.7)

## 2023-12-07 ENCOUNTER — Other Ambulatory Visit: Payer: Self-pay | Admitting: Cardiology

## 2023-12-07 DIAGNOSIS — I10 Essential (primary) hypertension: Secondary | ICD-10-CM

## 2024-01-13 ENCOUNTER — Encounter (HOSPITAL_COMMUNITY): Payer: Self-pay

## 2024-01-15 ENCOUNTER — Other Ambulatory Visit: Payer: Self-pay | Admitting: Medical

## 2024-01-15 ENCOUNTER — Ambulatory Visit (HOSPITAL_COMMUNITY)
Admission: RE | Admit: 2024-01-15 | Discharge: 2024-01-15 | Disposition: A | Discharge status: Home / Self Care | Source: Ambulatory Visit | Attending: Medical | Admitting: Medical

## 2024-01-15 DIAGNOSIS — R9439 Abnormal result of other cardiovascular function study: Secondary | ICD-10-CM

## 2024-01-15 MED ORDER — GADOBUTROL 1 MMOL/ML IV SOLN
15.0000 mL | Freq: Once | INTRAVENOUS | Status: AC | PRN
  Administered 2024-01-15: 15 mL via INTRAVENOUS

## 2024-01-20 ENCOUNTER — Ambulatory Visit: Payer: Self-pay | Admitting: Medical

## 2024-02-15 ENCOUNTER — Other Ambulatory Visit: Payer: Self-pay | Admitting: Family Medicine

## 2024-02-15 DIAGNOSIS — I10 Essential (primary) hypertension: Secondary | ICD-10-CM

## 2024-03-03 ENCOUNTER — Ambulatory Visit: Attending: Medical | Admitting: Medical

## 2024-03-03 NOTE — Progress Notes (Deleted)
  Cardiology Office Note   Date:  03/03/2024  ID:  Brian Mclean, DOB 1986-04-17, MRN [id number] PCP: Petrina Pries, NP  [facility] Providers Cardiologist:  Madonna Large, DO { Click to update primary MD,subspecialty MD or APP then REFRESH:1}    History of Present Illness Brian Mclean is a 38 y.o. male with a history of hypertension, tobacco use, hyperlipidemia, chronic bronchitis, and obesity is being seen for heart cath follow-up.   The patient underwent echo stress testing on 08/30/2023 for DOE/chest pain, and during the test developed hypotension and had RV apical hypokinesis that resolved with stress.  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  As such, his outpatient cardiologist referred him for admission.  In the ER he was hemodynamically stable.  He had an elevated troponin with flat trend. Plan was for outpatient LHC. The patient was not happy the cath could be down over the weekend and he left AMA on 08/31/23.    Patient was seen 09/04/2023 and was agreeable to pursue left heart cath.  Left heart cath on 09/13/2023 showed tortuous coronary arteries with moderate proximal RCA stenosis in a tortuous segment, no evidence of obstructive CAD.  LVEDP was mildly elevated.  Recommended medical therapy for nonobstructive CAD.  Patient was last seen 11/26/2023 and is overall doing well.  Cardiac MRI was ordered.  ROS: ***  Studies Reviewed      *** Risk Assessment/Calculations {Does this patient have ATRIAL FIBRILLATION?:[phone]} No BP recorded.  {Refresh Note OR Click here to enter BP  :1}***       Physical Exam VS:  There were no vitals taken for this visit.       Wt Readings from Last 3 Encounters:  11/26/23 (!) 360 lb 6.4 oz (163.5 kg)  09/13/23 (!) 355 lb 11.2 oz (161.3 kg)  09/04/23 (!) 351 lb (159.2 kg)    GEN: Well nourished, well developed in no acute distress NECK: No JVD; No carotid bruits CARDIAC: ***RRR, no murmurs, rubs,  gallops RESPIRATORY:  Clear to auscultation without rales, wheezing or rhonchi  ABDOMEN: Soft, non-tender, non-distended EXTREMITIES:  No edema; No deformity   ASSESSMENT AND PLAN ***    {Are you ordering a CV Procedure (e.g. stress test, cath, DCCV, TEE, etc)?   Press F2        :[id number]}  Dispo: ***  Signed, Huriel Matt VEAR Fishman, PA-C

## 2024-03-05 ENCOUNTER — Encounter: Payer: Self-pay | Admitting: Medical

## 2024-03-12 ENCOUNTER — Encounter: Payer: BC Managed Care – PPO | Admitting: Family Medicine

## 2024-03-12 ENCOUNTER — Ambulatory Visit: Payer: Self-pay

## 2024-03-12 NOTE — Progress Notes (Deleted)
 I,Jameka J Llittleton, CMA,acting as a neurosurgeon for [employer], NP.,have documented all relevant documentation on the behalf of Bruna Creighton, NP,as directed by  Bruna Creighton, NP while in the presence of Bruna Creighton, NP.  Subjective:   Patient ID: Brian Mclean , male    DOB: 01-28-1986 , 38 y.o.   MRN: [id number]  No chief complaint on file.   HPI  Patient presents today for a physical. Patient reports Dr.Furth is his cardiologist. Patient reports compliance with his meds. Patient denies having chest pain,sob or headaches at this time.       Past Medical History:  Diagnosis Date   Benign hypertension    High blood pressure    Hypercholesteremia      Family History  Problem Relation Age of Onset   Hypertension Mother    Congestive Heart Failure Mother    Diabetes Father    Blindness Father    Hypertension Maternal Grandmother    Congestive Heart Failure Maternal Grandmother    Hypertension Maternal Grandfather    Congestive Heart Failure Maternal Grandfather    COPD Maternal Grandfather      Current Outpatient Medications:    acetaminophen  (TYLENOL ) 500 MG tablet, Take 1,000 mg by mouth 2 (two) times daily as needed for moderate pain., Disp: , Rfl:    Albuterol -Budesonide (AIRSUPRA ) 90-80 MCG/ACT AERO, Inhale 2 puffs into the lungs every 4 (four) hours as needed., Disp: 32.1 g, Rfl: 3   aspirin  EC 81 MG tablet, Take 1 tablet (81 mg total) by mouth daily. Swallow whole., Disp: 90 tablet, Rfl: 3   atorvastatin  (LIPITOR) 40 MG tablet, Take 1 tablet (40 mg total) by mouth daily., Disp: 30 tablet, Rfl: 6   benzonatate  (TESSALON  PERLES) 100 MG capsule, Take 1 capsule (100 mg total) by mouth 3 (three) times daily as needed for cough., Disp: 30 capsule, Rfl: 1   hydrochlorothiazide  (HYDRODIURIL ) 25 MG tablet, TAKE 1 TABLET (25 MG TOTAL) BY MOUTH DAILY., Disp: 30 tablet, Rfl: 2   labetalol  (NORMODYNE ) 200 MG tablet, TAKE 1 TABLET BY MOUTH TWICE A DAY, Disp: 180 tablet, Rfl: 2   losartan   (COZAAR ) 50 MG tablet, TAKE 1 TABLET BY MOUTH EVERY DAY, Disp: 90 tablet, Rfl: 2   nicotine  (NICODERM CQ ) 14 mg/24hr patch, Place 1 patch (14 mg total) onto the skin daily., Disp: 28 patch, Rfl: 2   Allergies  Allergen Reactions   Penicillins Swelling     Men's preventive visit. Patient Health Questionnaire (PHQ-2) is  Flowsheet Row Office Visit from 03/07/2023 in [facility]  PHQ-2 Total Score 0  . Patient is on a *** diet. Marital status: Single. Relevant history for alcohol use is:  Social History   Substance and Sexual Activity  Alcohol Use Yes   Comment: socially  . Relevant history for tobacco use is:  Social History   Tobacco Use  Smoking Status Some Days   Current packs/day: 0.00   Types: Cigarettes   Last attempt to quit: 12/27/2016   Years since quitting: 7.2  Smokeless Tobacco Never  .   Review of Systems  Constitutional: Negative.   HENT: Negative.    Eyes: Negative.   Respiratory: Negative.    Cardiovascular: Negative.   Gastrointestinal: Negative.   Endocrine: Negative.   Genitourinary: Negative.   Musculoskeletal: Negative.   Skin: Negative.   Neurological: Negative.   Hematological: Negative.   Psychiatric/Behavioral: Negative.       There were no vitals filed for this  visit. There is no height or weight on file to calculate BMI.  Wt Readings from Last 3 Encounters:  11/26/23 (!) 360 lb 6.4 oz (163.5 kg)  09/13/23 (!) 355 lb 11.2 oz (161.3 kg)  09/04/23 (!) 351 lb (159.2 kg)    Objective:  Physical Exam      Assessment And Plan:    Encounter for general adult medical examination w/o abnormal findings  Primary hypertension  Hyperlipemia, mixed  Influenza vaccination declined     No follow-ups on file. Patient was given opportunity to ask questions. Patient verbalized understanding of the plan and was able to repeat key elements of the plan. All questions were answered to their satisfaction.   Bruna Creighton, NP  I, Bruna Creighton, NP, have reviewed all documentation for this visit. The documentation on 03/12/24 for the exam, diagnosis, procedures, and orders are all accurate and complete.

## 2024-03-12 NOTE — Telephone Encounter (Signed)
 FYI Only or Action Required?: FYI only for provider: appointment scheduled on 03/25/2024 at 9:00 AM. Scheduled patient for appointment for physical in January 2026.  Patient was last seen in primary care on 06/12/2023 by Petrina Pries, NP.  Called Nurse Triage reporting Leg Swelling.  Symptoms began several weeks ago.  Interventions attempted: Rest, hydration, or home remedies.  Symptoms are: unchanged.  Triage Disposition: No disposition on file.  Patient/caregiver understands and will follow disposition?:   Copied from CRM #[id number]. Topic: Clinical - Red Word Triage >> Mar 12, 2024 11:34 AM Antwanette L wrote: Red Word that prompted transfer to Nurse Triage: Patient is experiencing swelling in both legs. Patient needs to r/s physical Reason for Disposition  [1] MILD swelling of both ankles (i.e., pedal edema) AND [2] is a chronic symptom (recurrent or ongoing AND present > 4 weeks)  Answer Assessment - Initial Assessment Questions Patient called to cancel his appointment today for his physical after the scheduled time. Patient reporting mild-moderate swelling to his legs from ankles to calf on both legs. Patient endorses pain to legs due to swelling. Patient is scheduled for an appointment for his physical in January 2026. Patient was scheduled for an acute appointment for leg swelling. Patient was educated on canceling appointments 24 hours in advanced if possible. Explained no show policy. Patient verbalized understanding.   1. ONSET: When did the swelling start? (e.g., minutes, hours, days)     Started several weeks ago  2. LOCATION: What part of the leg is swollen?  Are both legs swollen or just one leg?     Bilateral leg swelling-from ankles to calf 3. SEVERITY: How bad is the swelling? (e.g., localized; mild, moderate, severe)     mild 4. REDNESS: Is there redness or signs of infection?     redness 5. PAIN: Is the swelling painful to touch? If Yes, ask: How painful  is it?   (Scale 1-10; mild, moderate or severe)     7 out of 10 6. FEVER: Do you have a fever? If Yes, ask: What is it, how was it measured, and when did it start?      no 7. CAUSE: What do you think is causing the leg swelling?     unsure 8. MEDICAL HISTORY: Do you have a history of blood clots (e.g., DVT), cancer, heart failure, kidney disease, or liver failure?     no 9. RECURRENT SYMPTOM: Have you had leg swelling before? If Yes, ask: When was the last time? What happened that time?     yes 10. OTHER SYMPTOMS: Do you have any other symptoms? (e.g., chest pain, difficulty breathing)       no  Protocols used: Leg Swelling and Edema-A-AH

## 2024-03-18 ENCOUNTER — Other Ambulatory Visit: Payer: Self-pay | Admitting: Family Medicine

## 2024-03-18 ENCOUNTER — Telehealth: Payer: Self-pay | Admitting: Cardiology

## 2024-03-18 DIAGNOSIS — I10 Essential (primary) hypertension: Secondary | ICD-10-CM

## 2024-03-18 MED ORDER — LOSARTAN POTASSIUM 50 MG PO TABS: 50.0000 mg | ORAL_TABLET | Freq: Every day | ORAL | 1 refills | Status: DC

## 2024-03-18 NOTE — Telephone Encounter (Unsigned)
 Copied from CRM #[id number]. Topic: Clinical - Medication Refill >> Mar 18, 2024  3:13 PM Donna E wrote: Medication:  hydrochlorothiazide  (HYDRODIURIL ) 25 MG tablet atorvastatin  (LIPITOR) 40 MG tablet    Has the patient contacted their pharmacy? Yes Pharmacy said to call provider  This is the patient's preferred pharmacy:   [id number] [place], [place] - [address] [place] [address] Phone: [phone] Fax: [phone]  Is this the correct pharmacy for this prescription? Yes If no, delete pharmacy and type the correct one.   Has the prescription been filled recently? Yes  Is the patient out of the medication? Yes  Has the patient been seen for an appointment in the last year OR does the patient have an upcoming appointment? Yes  Can we respond through MyChart? Yes  Agent: Please be advised that Rx refills may take up to 3 business days. We ask that you follow-up with your pharmacy.

## 2024-03-18 NOTE — Telephone Encounter (Signed)
*  STAT* If patient is at the pharmacy, call can be transferred to refill team.   1. Which medications need to be refilled? (please list name of each medication and dose if known)   atorvastatin  (LIPITOR) 40 MG tablet  losartan  (COZAAR ) 50 MG tablet   2. Would you like to learn more about the convenience, safety, & potential cost savings by using the [facility]?   3. Are you open to using the [facility] (Type [facility]. ).  4. Which pharmacy/location (including street and city if local pharmacy) is medication to be sent to?  [id number] - [place], [place] - [address]   5. Do they need a 30 day or 90 day supply?   90 day  Patient stated he is complete out of these medications.   Patient has appointment scheduled with Dr. Michele on 1/6.

## 2024-03-23 MED ORDER — ATORVASTATIN CALCIUM 40 MG PO TABS: 40.0000 mg | ORAL_TABLET | Freq: Every day | ORAL | 0 refills | Status: DC

## 2024-03-23 NOTE — Addendum Note (Signed)
 Addended by: MEMORY DELON POUR on: 03/23/2024 07:41 AM   Modules accepted: Orders

## 2024-03-23 NOTE — Telephone Encounter (Signed)
 Refill for Losartan  was already sent 03/18/24.  Sent in refill for Atorvastatin .

## 2024-03-25 ENCOUNTER — Ambulatory Visit: Payer: Self-pay | Admitting: Family Medicine

## 2024-03-25 ENCOUNTER — Encounter: Payer: Self-pay | Admitting: Family Medicine

## 2024-03-25 VITALS — BP 126/80 | HR 85 | Temp 98.3°F | Ht 71.0 in | Wt 375.0 lb

## 2024-03-25 DIAGNOSIS — R7309 Other abnormal glucose: Secondary | ICD-10-CM | POA: Diagnosis not present

## 2024-03-25 DIAGNOSIS — I10 Essential (primary) hypertension: Secondary | ICD-10-CM | POA: Diagnosis not present

## 2024-03-25 DIAGNOSIS — E782 Mixed hyperlipidemia: Secondary | ICD-10-CM | POA: Diagnosis not present

## 2024-03-25 DIAGNOSIS — Z2821 Immunization not carried out because of patient refusal: Secondary | ICD-10-CM

## 2024-03-25 DIAGNOSIS — Z0000 Encounter for general adult medical examination without abnormal findings: Secondary | ICD-10-CM | POA: Diagnosis not present

## 2024-03-25 DIAGNOSIS — J42 Unspecified chronic bronchitis: Secondary | ICD-10-CM

## 2024-03-25 DIAGNOSIS — Z6841 Body Mass Index (BMI) 40.0 and over, adult: Secondary | ICD-10-CM

## 2024-03-25 DIAGNOSIS — F1210 Cannabis abuse, uncomplicated: Secondary | ICD-10-CM

## 2024-03-25 LAB — POCT URINALYSIS DIP (CLINITEK)
Bilirubin, UA: NEGATIVE
Glucose, UA: NEGATIVE mg/dL
Ketones, POC UA: NEGATIVE mg/dL
Leukocytes, UA: NEGATIVE
Nitrite, UA: NEGATIVE
POC PROTEIN,UA: 100 — AB
Spec Grav, UA: 1.02 (ref 1.010–1.025)
Urobilinogen, UA: 0.2 U/dL
pH, UA: 7 (ref 5.0–8.0)

## 2024-03-25 MED ORDER — AIRSUPRA 90-80 MCG/ACT IN AERO: 2.0000 | INHALATION_SPRAY | Freq: Four times a day (QID) | RESPIRATORY_TRACT | 1 refills | Status: AC | PRN

## 2024-03-25 MED ORDER — HYDROCHLOROTHIAZIDE 25 MG PO TABS: 25.0000 mg | ORAL_TABLET | Freq: Every day | ORAL | 2 refills | Status: DC

## 2024-03-25 NOTE — Progress Notes (Unsigned)
 I,Jameka J Llittleton, CMA,acting as a neurosurgeon for [employer], NP.,have documented all relevant documentation on the behalf of Bruna Creighton, NP,as directed by  Bruna Creighton, NP while in the presence of Bruna Creighton, NP.  Subjective:  Patient ID: Brian Mclean , male    DOB: [birth year] , 38 y.o.   MRN: [id number]  Chief Complaint  Patient presents with  . Joint Swelling    Patient presents today for ankle swelling. Patient reports the swelling started a couple months ago but the swelling has gotten worse. He reports he stopped wearing steel toe boots and tried different shoes but the swelling is still there. He also reports having a knot on the back of his heels.  He also reports having a lot of pain on his lower leg and ankles.    HPI Discussed the use of AI scribe software for clinical note transcription with the patient, who gave verbal consent to proceed.  History of Present Illness Brian Mclean is a 38 year old male with hypertension who presents with leg swelling.  He experiences swelling in his legs, particularly in the ankles, primarily after work. The swelling is not present in his feet but is noticeable from the ankles down. He elevates his legs nightly to manage the swelling and has recently changed to better-supported footwear at work.  He has a history of hypertension and is currently on labetalol  and losartan . He experiences shortness of breath only during strenuous activity and uses albuterol  for occasional wheezing and coughing, especially during cold weather. He has not completed a stress test due to illness at the time of the appointment.  He quit smoking cigarettes two months ago and now smokes marijuana more frequently, which he states helps with anxiety and appetite. He acknowledges a family history of congestive heart failure, affecting his mother and grandmother.  He mentions financial concerns regarding his medications, specifically the cost of his cholesterol  medication, labetalol , and losartan , which he receives in 30-day supplies instead of the prescribed 90-day supplies.   HPI   Past Medical History:  Diagnosis Date  . Benign hypertension   . High blood pressure   . Hypercholesteremia      Family History  Problem Relation Age of Onset  . Hypertension Mother   . Congestive Heart Failure Mother   . Diabetes Father   . Blindness Father   . Hypertension Maternal Grandmother   . Congestive Heart Failure Maternal Grandmother   . Hypertension Maternal Grandfather   . Congestive Heart Failure Maternal Grandfather   . COPD Maternal Grandfather      Current Outpatient Medications:  .  acetaminophen  (TYLENOL ) 500 MG tablet, Take 1,000 mg by mouth 2 (two) times daily as needed for moderate pain., Disp: , Rfl:  .  albuterol  (VENTOLIN  HFA) 108 (90 Base) MCG/ACT inhaler, Inhale 1-2 puffs into the lungs every 6 (six) hours as needed for wheezing or shortness of breath., Disp: 1 each, Rfl: 0 .  aspirin  EC 81 MG tablet, Take 1 tablet (81 mg total) by mouth daily. Swallow whole., Disp: 90 tablet, Rfl: 3 .  atorvastatin  (LIPITOR) 40 MG tablet, Take 1 tablet (40 mg total) by mouth daily., Disp: 90 tablet, Rfl: 0 .  hydrochlorothiazide  (HYDRODIURIL ) 25 MG tablet, TAKE 1 TABLET (25 MG TOTAL) BY MOUTH DAILY., Disp: 30 tablet, Rfl: 2 .  labetalol  (NORMODYNE ) 200 MG tablet, TAKE 1 TABLET BY MOUTH TWICE A DAY, Disp: 180 tablet, Rfl: 2 .  losartan  (COZAAR ) 50 MG tablet,  Take 1 tablet (50 mg total) by mouth daily., Disp: 90 tablet, Rfl: 1 .  Albuterol -Budesonide (AIRSUPRA ) 90-80 MCG/ACT AERO, Inhale 2 puffs into the lungs every 4 (four) hours as needed. (Patient not taking: Reported on 03/25/2024), Disp: 32.1 g, Rfl: 3 .  benzonatate  (TESSALON  PERLES) 100 MG capsule, Take 1 capsule (100 mg total) by mouth 3 (three) times daily as needed for cough. (Patient not taking: Reported on 03/25/2024), Disp: 30 capsule, Rfl: 1 .  nicotine  (NICODERM CQ ) 14 mg/24hr patch,  Place 1 patch (14 mg total) onto the skin daily. (Patient not taking: Reported on 03/25/2024), Disp: 28 patch, Rfl: 2   Allergies  Allergen Reactions  . Penicillins Swelling     Review of Systems  Constitutional: Negative.   Eyes: Negative.   Musculoskeletal: Negative.   Skin: Negative.   Psychiatric/Behavioral: Negative.       Today's Vitals   03/25/24 0902  BP: 126/80  Pulse: 85  Temp: 98.3 F (36.8 C)  TempSrc: Oral  Weight: (!) 375 lb (170.1 kg)  Height: 5' 11 (1.803 m)  PainSc: 8   PainLoc: Ankle   Body mass index is 52.3 kg/m.  Wt Readings from Last 3 Encounters:  03/25/24 (!) 375 lb (170.1 kg)  11/26/23 (!) 360 lb 6.4 oz (163.5 kg)  09/13/23 (!) 355 lb 11.2 oz (161.3 kg)    The ASCVD Risk score (Arnett DK, et al., 2019) failed to calculate for the following reasons:   The 2019 ASCVD risk score is only valid for ages 65 to 25  Objective:  Physical Exam      Assessment And Plan:   Assessment & Plan Annual physical exam  Influenza vaccination declined  Primary hypertension  Chronic bronchitis with wheezing (HCC)  Hyperlipemia, mixed  Abnormal glucose  Benign hypertension without CHF  Marijuana abuse, continuous  Morbid obesity with BMI of 50.0-59.9, adult (HCC)       No follow-ups on file.  Patient was given opportunity to ask questions. Patient verbalized understanding of the plan and was able to repeat key elements of the plan. All questions were answered to their satisfaction.    I, Bruna Creighton, NP, have reviewed all documentation for this visit. The documentation on 03/25/24 for the exam, diagnosis, procedures, and orders are all accurate and complete.   IF YOU HAVE BEEN REFERRED TO A SPECIALIST, IT MAY TAKE 1-2 WEEKS TO SCHEDULE/PROCESS THE REFERRAL. IF YOU HAVE NOT HEARD FROM US /SPECIALIST IN TWO WEEKS, PLEASE GIVE US  A CALL AT [phone].

## 2024-03-26 LAB — CBC WITH DIFFERENTIAL/PLATELET
Basophils Absolute: 0 x10E3/uL (ref 0.0–0.2)
Basos: 1 %
EOS (ABSOLUTE): 0.2 x10E3/uL (ref 0.0–0.4)
Eos: 3 %
Hematocrit: 47.8 % (ref 37.5–51.0)
Hemoglobin: 15.1 g/dL (ref 13.0–17.7)
Immature Grans (Abs): 0 x10E3/uL (ref 0.0–0.1)
Immature Granulocytes: 0 %
Lymphocytes Absolute: 2.1 x10E3/uL (ref 0.7–3.1)
Lymphs: 36 %
MCH: 25.9 pg — ABNORMAL LOW (ref 26.6–33.0)
MCHC: 31.6 g/dL (ref 31.5–35.7)
MCV: 82 fL (ref 79–97)
Monocytes Absolute: 0.4 x10E3/uL (ref 0.1–0.9)
Monocytes: 7 %
Neutrophils Absolute: 3.1 x10E3/uL (ref 1.4–7.0)
Neutrophils: 53 %
Platelets: 212 x10E3/uL (ref 150–450)
RBC: 5.82 x10E6/uL — ABNORMAL HIGH (ref 4.14–5.80)
RDW: 13.6 % (ref 11.6–15.4)
WBC: 5.9 x10E3/uL (ref 3.4–10.8)

## 2024-03-26 LAB — HEMOGLOBIN A1C
Est. average glucose Bld gHb Est-mCnc: 131 mg/dL
Hgb A1c MFr Bld: 6.2 % — ABNORMAL HIGH (ref 4.8–5.6)

## 2024-03-26 LAB — BASIC METABOLIC PANEL WITH GFR
BUN/Creatinine Ratio: 16 (ref 9–20)
BUN: 18 mg/dL (ref 6–20)
CO2: 28 mmol/L (ref 20–29)
Calcium: 9.3 mg/dL (ref 8.7–10.2)
Chloride: 99 mmol/L (ref 96–106)
Creatinine, Ser: 1.13 mg/dL (ref 0.76–1.27)
Glucose: 98 mg/dL (ref 70–99)
Potassium: 4.5 mmol/L (ref 3.5–5.2)
Sodium: 141 mmol/L (ref 134–144)
eGFR: 86 mL/min/1.73 (ref >59)

## 2024-03-26 LAB — LIPID PANEL
Chol/HDL Ratio: 5.1 ratio — ABNORMAL HIGH (ref 0.0–5.0)
Cholesterol, Total: 257 mg/dL — ABNORMAL HIGH (ref 100–199)
HDL: 50 mg/dL (ref >39)
LDL Chol Calc (NIH): 185 mg/dL — ABNORMAL HIGH (ref 0–99)
Triglycerides: 121 mg/dL (ref 0–149)
VLDL Cholesterol Cal: 22 mg/dL (ref 5–40)

## 2024-03-26 LAB — MICROALBUMIN / CREATININE URINE RATIO
Creatinine, Urine: 76.8 mg/dL
Microalb/Creat Ratio: 337 mg/g{creat} — ABNORMAL HIGH (ref 0–29)
Microalbumin, Urine: 258.9 ug/mL

## 2024-04-01 ENCOUNTER — Ambulatory Visit: Payer: Self-pay | Admitting: Family Medicine

## 2024-04-01 DIAGNOSIS — R7309 Other abnormal glucose: Secondary | ICD-10-CM | POA: Insufficient documentation

## 2024-04-01 DIAGNOSIS — R600 Localized edema: Secondary | ICD-10-CM | POA: Insufficient documentation

## 2024-04-01 DIAGNOSIS — F1210 Cannabis abuse, uncomplicated: Secondary | ICD-10-CM | POA: Insufficient documentation

## 2024-04-01 DIAGNOSIS — I10 Essential (primary) hypertension: Secondary | ICD-10-CM | POA: Insufficient documentation

## 2024-04-01 NOTE — Progress Notes (Signed)
 Your cholesterol levels are very elevated. I know the cardiologist just sent your prescriptions for cholesterol medications on 03/23/2024, please take them as prescribed and also eat a diet in low fats and fried foods. Your A1c is 6.2 which means that you are prediabetic, also eat a low carb diet  and exercise too.  Your urine indicates that your kidneys maybe allowing some microalbumin(protein) pass into your urine, which ought not to be, so will start you on Farxiga, once daily.  Come get some samples in the office.   Thanks!

## 2024-04-01 NOTE — Assessment & Plan Note (Signed)
 Encouraged smoking cessation

## 2024-04-01 NOTE — Assessment & Plan Note (Signed)
 Check lipids

## 2024-04-01 NOTE — Assessment & Plan Note (Signed)
-   Prescribed AirSupra  inhaler with coupon for zero copay. -use as prescribed for wheezing

## 2024-04-01 NOTE — Assessment & Plan Note (Signed)
-   Recommended knee-length compression stockings during the day. - Advised leg elevation at night. - Continue hydrochlorothiazide  daily.

## 2024-04-01 NOTE — Assessment & Plan Note (Signed)
.  Hypertension well-controlled with stable blood pressure readings. - Continue losartan  and labetalol . - Encouraged smoking cessation.

## 2024-04-01 NOTE — Assessment & Plan Note (Signed)
 Contributing to cardiovascular risk, attempting lifestyle modifications. - Encouraged continued physical activity and dietary changes. - Discussed weight management importance for cardiovascular risk reduction.

## 2024-04-01 NOTE — Assessment & Plan Note (Signed)
 Check A1c.

## 2024-04-05 ENCOUNTER — Other Ambulatory Visit: Payer: Self-pay | Admitting: Family Medicine

## 2024-04-05 DIAGNOSIS — I10 Essential (primary) hypertension: Secondary | ICD-10-CM

## 2024-04-07 DIAGNOSIS — R062 Wheezing: Secondary | ICD-10-CM | POA: Diagnosis not present

## 2024-04-07 DIAGNOSIS — J069 Acute upper respiratory infection, unspecified: Secondary | ICD-10-CM | POA: Diagnosis not present

## 2024-04-07 DIAGNOSIS — R0602 Shortness of breath: Secondary | ICD-10-CM | POA: Diagnosis not present

## 2024-04-08 MED ORDER — DAPAGLIFLOZIN PROPANEDIOL 10 MG PO TABS: 10.0000 mg | ORAL_TABLET | Freq: Every day | ORAL | 1 refills | Status: AC

## 2024-04-28 ENCOUNTER — Ambulatory Visit: Attending: Cardiology | Admitting: Cardiology

## 2024-04-28 ENCOUNTER — Other Ambulatory Visit (HOSPITAL_COMMUNITY): Payer: Self-pay

## 2024-04-28 ENCOUNTER — Encounter: Payer: Self-pay | Admitting: Cardiology

## 2024-04-28 VITALS — BP 130/80 | HR 90 | Resp 16 | Ht 71.0 in | Wt 375.2 lb

## 2024-04-28 DIAGNOSIS — Z87891 Personal history of nicotine dependence: Secondary | ICD-10-CM | POA: Diagnosis not present

## 2024-04-28 DIAGNOSIS — E782 Mixed hyperlipidemia: Secondary | ICD-10-CM | POA: Diagnosis not present

## 2024-04-28 DIAGNOSIS — I2510 Atherosclerotic heart disease of native coronary artery without angina pectoris: Secondary | ICD-10-CM | POA: Diagnosis not present

## 2024-04-28 DIAGNOSIS — R0683 Snoring: Secondary | ICD-10-CM | POA: Diagnosis not present

## 2024-04-28 DIAGNOSIS — F1210 Cannabis abuse, uncomplicated: Secondary | ICD-10-CM | POA: Diagnosis not present

## 2024-04-28 DIAGNOSIS — I10 Essential (primary) hypertension: Secondary | ICD-10-CM

## 2024-04-28 MED ORDER — OMRON 3 SERIES BP MONITOR DEVI
1.0000 | Freq: Every day | 0 refills | Status: AC
  Filled 2024-04-28: qty 1, 30d supply, fill #0

## 2024-04-28 MED ORDER — ATORVASTATIN CALCIUM 40 MG PO TABS: 40.0000 mg | ORAL_TABLET | Freq: Every day | ORAL | 0 refills | Status: AC

## 2024-04-28 MED ORDER — LOSARTAN POTASSIUM 50 MG PO TABS: 50.0000 mg | ORAL_TABLET | Freq: Every day | ORAL | 1 refills | Status: AC

## 2024-04-28 NOTE — Progress Notes (Signed)
 "   Cardiology Office Note:    NAME:  Brian Mclean    MRN: [id number] DOB:  1986-04-16   PCP:  Petrina Pries, NP  Former Cardiology Providers: None Primary Cardiologist:  Madonna Large, DO, [facility] (established care 07/16/2023) Electrophysiologist:  None   Chief Complaint  Patient presents with   Follow-up    1 year - HTN and Abnormal EKG     History of Present Illness:    Brian Mclean is a 39 y.o. African-American male whose past medical history and cardiovascular risk factors includes: Nonobstructive CAD, HLD, Hypertension, family history of heart failure and heart disease, obesity, former cigarette smoking, marijuana use. He was referred to the practice for the evaluation of abnormal EKG at the request of Petrina Pries, NP.  During his last office visit with myself patient was endorsing chest pain.  He underwent stress echocardiogram in May 2025 during which time he developed hypotension and findings concerning for RV dysfunction.  He also had frequent polymorphic PVCs/ventricular ectopy.  He was sent to the ED for further evaluation.  Plan was to proceed with left heart catheterization after the weekend.  However patient left AMA on 08/31/2023.  He was later seen in the office and left heart catheterization was performed on 09/13/2023 which noted tortuous coronary arteries with moderate proximal RCA disease otherwise no obstructive disease.  Medical management for nonobstructive CAD was recommended.  Since then he is also undergone cardiac MRI results reviewed and noted below.  He presents today for follow-up.  Denies anginal chest pain or heart failure symptoms. Overall function capacity remains relatively stable. Short of breath with over exertional activities. Recent labs from [facility] database December 2025 reviewed as part of today's visit.  Benign essential hypertension: Diagnosed in 2019, per patient. Blood pressures are better controlled on current medical regimen. However, he  does not have a blood pressure cuff at home and therefore has not been checking it. But focuses on a low-salt diet and walking is much as possible.  Cigarette smoking: Started smoking at the age of 15 averaging half a pack per day. Stopped smoking as of June 2025.  Congratulated on his efforts.    Marijuana use: Use to consume 2 to 3 g/day now it is very minimal.  With focus on complete cessation in the future.  Patient states that his mother passed away at the age of 48 and her sleep, cause of death presumed to be heart attack.  Current Medications: Current Meds  Medication Sig   acetaminophen  (TYLENOL ) 500 MG tablet Take 1,000 mg by mouth 2 (two) times daily as needed for moderate pain.   albuterol  (VENTOLIN  HFA) 108 (90 Base) MCG/ACT inhaler Inhale 1-2 puffs into the lungs every 6 (six) hours as needed for wheezing or shortness of breath.   Albuterol -Budesonide (AIRSUPRA ) 90-80 MCG/ACT AERO Inhale 2 puffs into the lungs every 6 (six) hours as needed.   aspirin  EC 81 MG tablet Take 1 tablet (81 mg total) by mouth daily. Swallow whole.   benzonatate  (TESSALON  PERLES) 100 MG capsule Take 1 capsule (100 mg total) by mouth 3 (three) times daily as needed for cough.   Blood Pressure Monitoring (OMRON 3 SERIES BP MONITOR) DEVI Use as directed.   dapagliflozin  propanediol (FARXIGA ) 10 MG TABS tablet Take 1 tablet (10 mg total) by mouth daily before breakfast.   hydrochlorothiazide  (HYDRODIURIL ) 25 MG tablet TAKE 1 TABLET (25 MG TOTAL) BY MOUTH DAILY.   labetalol  (NORMODYNE ) 200 MG tablet TAKE 1  TABLET BY MOUTH TWICE A DAY   [DISCONTINUED] atorvastatin  (LIPITOR) 40 MG tablet Take 1 tablet (40 mg total) by mouth daily.   [DISCONTINUED] losartan  (COZAAR ) 50 MG tablet Take 1 tablet (50 mg total) by mouth daily.     Allergies:    Penicillins   Past Medical History: Past Medical History:  Diagnosis Date   Benign hypertension    High blood pressure    Hypercholesteremia     Past Surgical  History: Past Surgical History:  Procedure Laterality Date   LEFT HEART CATH AND CORONARY ANGIOGRAPHY Left 09/13/2023   Procedure: LEFT HEART CATH AND CORONARY ANGIOGRAPHY;  Surgeon: Darron Deatrice LABOR, MD;  Location: [facility] INVASIVE CV LAB;  Service: Cardiovascular;  Laterality: Left;   VENTRAL HERNIA REPAIR N/A 07/24/2022   Procedure: OPEN HERNIA REPAIR VENTRAL ADULT WITH MESH;  Surgeon: Vanderbilt Ned, MD;  Location: [facility] OR;  Service: General;  Laterality: N/A;    Social History: Social History   Tobacco Use   Smoking status: Some Days    Current packs/day: 0.00    Types: Cigarettes    Last attempt to quit: 12/27/2016    Years since quitting: 7.3   Smokeless tobacco: Never  Vaping Use   Vaping status: Some Days  Substance Use Topics   Alcohol use: Yes    Comment: socially   Drug use: Yes    Types: Marijuana    Family History: Family History  Problem Relation Age of Onset   Hypertension Mother    Congestive Heart Failure Mother    Diabetes Father    Blindness Father    Hypertension Maternal Grandmother    Congestive Heart Failure Maternal Grandmother    Hypertension Maternal Grandfather    Congestive Heart Failure Maternal Grandfather    COPD Maternal Grandfather     ROS:   Review of Systems  Cardiovascular:  Positive for chest pain and dyspnea on exertion. Negative for claudication, irregular heartbeat, leg swelling, near-syncope, orthopnea, palpitations, paroxysmal nocturnal dyspnea and syncope.  Hematologic/Lymphatic: Negative for bleeding problem.    EKGs/Labs/Other Studies Reviewed:   Echocardiogram: 08/20/2023 1. Prominent apical chords/trabeculations. Left ventricular ejection fraction, by estimation, is 50%. The left ventricle has low normal function. The left ventricle has no regional wall motion abnormalities. There is mild left ventricular hypertrophy.  Left ventricular diastolic parameters are indeterminate. 2. Right ventricular systolic function is normal.  The right ventricular size is normal. Tricuspid regurgitation signal is inadequate for assessing PA pressure. 3. The mitral valve is normal in structure. Trivial mitral valve regurgitation. No evidence of mitral stenosis. 4. The aortic valve is tricuspid. Aortic valve regurgitation is not visualized. No aortic stenosis is present. 5. The inferior vena cava is normal in size with greater than 50% respiratory variability, suggesting right atrial pressure of 3 mmHg.   Left heart and coronary angiography 09/13/2023 1.  Tortuous coronary arteries with moderate proximal RCA stenosis in a tortuous segment.  No evidence of obstructive coronary artery disease. 2.  Left ventricular angiography was not performed.  EF was low normal by echo.  Mildly elevated left ventricular end-diastolic pressure at 20 mmHg.   Recommendations: Recommend medical therapy for nonobstructive coronary artery disease.  cMRI  01/15/2024 1.  Technically difficult study due to motion artifact.   2. Normal LV size with mild LV hypertrophy. Mild global hypokinesis, LV EF 51%.   3. The RV was not well-visualized. It was normal in size. Mild RV systolic dysfunction, RV EF 42%.   4. No myocardial LGE,  so no definitive evidence for prior MI, infiltrative disease, or cardiomyopathy.   5.  Normal T1 and T2 parametrics.  Labs:    Latest Ref Rng & Units 03/25/2024   10:06 AM 11/26/2023   10:46 AM 08/31/2023    2:14 AM  CBC  WBC 3.4 - 10.8 x10E3/uL 5.9   7.8   Hemoglobin 13.0 - 17.7 g/dL 84.8  85.2  85.6   Hematocrit 37.5 - 51.0 % 47.8  46.5  44.1   Platelets 150 - 450 x10E3/uL 212   215        Latest Ref Rng & Units 03/25/2024   10:06 AM 08/31/2023    2:14 AM 08/30/2023    5:19 PM  BMP  Glucose 70 - 99 mg/dL 98  864  872   BUN 6 - 20 mg/dL 18  14  12    Creatinine 0.76 - 1.27 mg/dL 8.86  8.77  8.75   BUN/Creat Ratio 9 - 20 16     Sodium 134 - 144 mmol/L 141  139  139   Potassium 3.5 - 5.2 mmol/L 4.5  3.8  3.4   Chloride  96 - 106 mmol/L 99  99  99   CO2 20 - 29 mmol/L 28  27  29    Calcium  8.7 - 10.2 mg/dL 9.3  9.4  9.6       Latest Ref Rng & Units 03/25/2024   10:06 AM 08/31/2023    2:14 AM 08/30/2023    5:19 PM  CMP  Glucose 70 - 99 mg/dL 98  864  872   BUN 6 - 20 mg/dL 18  14  12    Creatinine 0.76 - 1.27 mg/dL 8.86  8.77  8.75   Sodium 134 - 144 mmol/L 141  139  139   Potassium 3.5 - 5.2 mmol/L 4.5  3.8  3.4   Chloride 96 - 106 mmol/L 99  99  99   CO2 20 - 29 mmol/L 28  27  29    Calcium  8.7 - 10.2 mg/dL 9.3  9.4  9.6     Lab Results  Component Value Date   CHOL 257 (H) 03/25/2024   HDL 50 03/25/2024   LDLCALC 185 (H) 03/25/2024   TRIG 121 03/25/2024   CHOLHDL 5.1 (H) 03/25/2024   No results for input(s): LIPOA in the last 8760 hours. No components found for: NTPROBNP No results for input(s): PROBNP in the last 8760 hours. Recent Labs    07/17/23 1002  TSH 0.509    Physical Exam:    Today's Vitals   04/28/24 0759  BP: 130/80  Pulse: 90  Resp: 16  SpO2: 95%  Weight: (!) 375 lb 3.2 oz (170.2 kg)  Height: 5' 11 (1.803 m)   Body mass index is 52.33 kg/m. Wt Readings from Last 3 Encounters:  04/28/24 (!) 375 lb 3.2 oz (170.2 kg)  03/25/24 (!) 375 lb (170.1 kg)  11/26/23 (!) 360 lb 6.4 oz (163.5 kg)    Physical Exam  Constitutional: No distress.  hemodynamically stable  Neck: No JVD present.  Cardiovascular: Normal rate, regular rhythm, S1 normal and S2 normal. Exam reveals no gallop, no S3 and no S4.  No murmur heard. Pulses:      Dorsalis pedis pulses are 2+ on the right side and 2+ on the left side.       Posterior tibial pulses are 2+ on the right side and 2+ on the left side.  Pulmonary/Chest: Effort normal and breath sounds  normal. No stridor. He has no wheezes. He has no rales.  Musculoskeletal:        General: Edema (trace bilateral) present.     Cervical back: Neck supple.  Skin: Skin is warm.    Impression & Recommendation(s):  Impression:   ICD-10-CM    1. Nonobstructive atherosclerosis of coronary artery  I25.10     2. Benign hypertension  I10 losartan  (COZAAR ) 50 MG tablet    3. Hyperlipidemia, mixed  E78.2 AMB Referral to [facility] Pharm-D    4. Former smoker  Z87.891     5. Marijuana abuse  F12.10     6. Snoring  R06.83 Itamar Sleep Study        Recommendation(s):  Nonobstructive atherosclerosis of coronary artery Denies anginal chest pain. Continue aspirin  81 mg p.o. daily. Continue Lipitor 40 mg p.o. nightly. Needs better lipid management. Reviewed the results of the echo, cardiac MRI, and left heart catheterization results with him in great detail today. Patient is motivated with regards to improving his modifiable cardiovascular risk factors  Benign hypertension Blood pressures have significantly improved We will refill losartan  50 milligrams p.o. daily Prior workup from 07/17/2023 Plasma Normetanephrine's metanephrine: normal TSH within normal limits. Cortisol within normal limits. Aldosterone to renin ratio within normal limits Protein to creatinine ratio elevated. Urine normetanephrine's 24HR is 804 (above normal limits)  Hyperlipidemia, mixed Index LDL 214 mg/dL Currently on Lipitor 40 mg p.o. nightly Most recent lipids from [facility] database 03/25/2024 LDL 185 mg/dL Will refer to Pharm.D. as he will need close monitoring of his lipids and uptitration of medical therapy  Snoring: Symptoms concerning for possible sleep apnea. STOP-BANG score is 7. Order Itamar study for now.   Former smoker Quit June 2025. Congratulated on his efforts  Marijuana abuse Focusing on complete marijuana cessation as well.  Patient is motivated.  Orders Placed:  Orders Placed This Encounter  Procedures   AMB Referral to [facility] Pharm-D    Referral Priority:   Routine    Referral Type:   Consultation    Referral Reason:   Specialty Services Required    Number of Visits Requested:   1   Itamar Sleep Study    Standing  Status:   Future    Expected Date:   05/05/2024    Expiration Date:   04/28/2025    Does the patient have access to and comfortable using a smartphone or tablet?:   Yes - WatchPat One. Device is disposable     Final Medication List:    Meds ordered this encounter  Medications   losartan  (COZAAR ) 50 MG tablet    Sig: Take 1 tablet (50 mg total) by mouth daily.    Dispense:  90 tablet    Refill:  1   atorvastatin  (LIPITOR) 40 MG tablet    Sig: Take 1 tablet (40 mg total) by mouth daily.    Dispense:  90 tablet    Refill:  0   Blood Pressure Monitoring (OMRON 3 SERIES BP MONITOR) DEVI    Sig: Use as directed.    Dispense:  1 each    Refill:  0    Medications Discontinued During This Encounter  Medication Reason   nicotine  (NICODERM CQ ) 14 mg/24hr patch Change in therapy   losartan  (COZAAR ) 50 MG tablet Reorder   atorvastatin  (LIPITOR) 40 MG tablet Reorder     Current Outpatient Medications:    acetaminophen  (TYLENOL ) 500 MG tablet, Take 1,000 mg by mouth 2 (two) times daily as  needed for moderate pain., Disp: , Rfl:    albuterol  (VENTOLIN  HFA) 108 (90 Base) MCG/ACT inhaler, Inhale 1-2 puffs into the lungs every 6 (six) hours as needed for wheezing or shortness of breath., Disp: 1 each, Rfl: 0   Albuterol -Budesonide (AIRSUPRA ) 90-80 MCG/ACT AERO, Inhale 2 puffs into the lungs every 6 (six) hours as needed., Disp: 32.1 g, Rfl: 1   aspirin  EC 81 MG tablet, Take 1 tablet (81 mg total) by mouth daily. Swallow whole., Disp: 90 tablet, Rfl: 3   benzonatate  (TESSALON  PERLES) 100 MG capsule, Take 1 capsule (100 mg total) by mouth 3 (three) times daily as needed for cough., Disp: 30 capsule, Rfl: 1   Blood Pressure Monitoring (OMRON 3 SERIES BP MONITOR) DEVI, Use as directed., Disp: 1 each, Rfl: 0   dapagliflozin  propanediol (FARXIGA ) 10 MG TABS tablet, Take 1 tablet (10 mg total) by mouth daily before breakfast., Disp: 90 tablet, Rfl: 1   hydrochlorothiazide  (HYDRODIURIL ) 25 MG tablet, TAKE 1  TABLET (25 MG TOTAL) BY MOUTH DAILY., Disp: 30 tablet, Rfl: 3   labetalol  (NORMODYNE ) 200 MG tablet, TAKE 1 TABLET BY MOUTH TWICE A DAY, Disp: 180 tablet, Rfl: 2   atorvastatin  (LIPITOR) 40 MG tablet, Take 1 tablet (40 mg total) by mouth daily., Disp: 90 tablet, Rfl: 0   losartan  (COZAAR ) 50 MG tablet, Take 1 tablet (50 mg total) by mouth daily., Disp: 90 tablet, Rfl: 1  Consent:    NA  Disposition:   1 year follow-up sooner if needed   His questions and concerns were addressed to his satisfaction. He voices understanding of the recommendations provided during this encounter.    Signed, Madonna Michele HAS, [facility]  A Division of [facility] [address] [place], [place] [address]  "

## 2024-04-28 NOTE — Patient Instructions (Signed)
 Medication Instructions:  Your physician recommends that you continue on your current medications as directed. Please refer to the Current Medication list given to you today.  *If you need a refill on your cardiac medications before your next appointment, please call your pharmacy*  Lab Work: None ordered If you have labs (blood work) drawn today and your tests are completely normal, you will receive your results only by: MyChart Message (if you have MyChart) OR A paper copy in the mail If you have any lab test that is abnormal or we need to change your treatment, we will call you to review the results.  Testing/Procedures: Itamar Sleep Study  Follow-Up: At [facility], you and your health needs are our priority.  As part of our continuing mission to provide you with exceptional heart care, our providers are all part of one team.  This team includes your primary Cardiologist (physician) and Advanced Practice Providers or APPs (Physician Assistants and Nurse Practitioners) who all work together to provide you with the care you need, when you need it.  Your next appointment:   1 year(s)  Provider:   Madonna Large, DO    We recommend signing up for the patient portal called MyChart.  Sign up information is provided on this After Visit Summary.  MyChart is used to connect with patients for Virtual Visits (Telemedicine).  Patients are able to view lab/test results, encounter notes, upcoming appointments, etc.  Non-urgent messages can be sent to your provider as well.   To learn more about what you can do with MyChart, go to [email or web address].   Other Instructions Your provider has ordered a sleep study. The company will be in touch with you and mail you a device in 6-8 weeks.   We are referring you to our Pharm D clinic for management of your Lipids.

## 2024-05-21 ENCOUNTER — Encounter: Payer: Self-pay | Admitting: Family Medicine

## 2024-05-29 ENCOUNTER — Encounter: Payer: Self-pay | Admitting: Family Medicine

## 2024-06-04 ENCOUNTER — Ambulatory Visit: Payer: Self-pay

## 2024-08-26 ENCOUNTER — Ambulatory Visit: Admitting: Family Medicine
